# Patient Record
Sex: Female | Born: 1985 | Race: Asian | Hispanic: No | Marital: Single | Smoking: Never smoker
Health system: Southern US, Community
[De-identification: ages and names within clinical notes are randomized; demographics above are authoritative.]

## PROBLEM LIST (undated history)

## (undated) ENCOUNTER — Inpatient Hospital Stay (HOSPITAL_COMMUNITY): Payer: No Typology Code available for payment source

## (undated) DIAGNOSIS — L0291 Cutaneous abscess, unspecified: Secondary | ICD-10-CM

## (undated) DIAGNOSIS — N739 Female pelvic inflammatory disease, unspecified: Secondary | ICD-10-CM

## (undated) DIAGNOSIS — J45909 Unspecified asthma, uncomplicated: Secondary | ICD-10-CM

## (undated) DIAGNOSIS — B977 Papillomavirus as the cause of diseases classified elsewhere: Secondary | ICD-10-CM

## (undated) HISTORY — PX: CHOLECYSTECTOMY: SHX55

---
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
## 2008-03-16 ENCOUNTER — Emergency Department (HOSPITAL_COMMUNITY): Admission: EM | Admit: 2008-03-16 | Discharge: 2008-03-16 | Payer: Self-pay | Admitting: *Deleted

## 2008-11-10 ENCOUNTER — Inpatient Hospital Stay (HOSPITAL_COMMUNITY): Admission: EM | Admit: 2008-11-10 | Discharge: 2008-11-12 | Payer: Self-pay | Admitting: Emergency Medicine

## 2010-07-02 ENCOUNTER — Emergency Department (HOSPITAL_COMMUNITY)
Admission: EM | Admit: 2010-07-02 | Discharge: 2010-07-02 | Payer: Self-pay | Source: Home / Self Care | Admitting: Emergency Medicine

## 2010-09-18 LAB — RAPID STREP SCREEN (MED CTR MEBANE ONLY): Streptococcus, Group A Screen (Direct): NEGATIVE

## 2010-10-17 LAB — DIFFERENTIAL
Basophils Absolute: 0 10*3/uL (ref 0.0–0.1)
Basophils Absolute: 0 10*3/uL (ref 0.0–0.1)
Basophils Absolute: 0.1 10*3/uL (ref 0.0–0.1)
Basophils Relative: 0 % (ref 0–1)
Basophils Relative: 0 % (ref 0–1)
Eosinophils Absolute: 0 10*3/uL (ref 0.0–0.7)
Eosinophils Relative: 0 % (ref 0–5)
Eosinophils Relative: 1 % (ref 0–5)
Lymphocytes Relative: 12 % (ref 12–46)
Lymphocytes Relative: 26 % (ref 12–46)
Lymphs Abs: 2.1 10*3/uL (ref 0.7–4.0)
Monocytes Absolute: 0.1 10*3/uL (ref 0.1–1.0)
Monocytes Absolute: 0.7 10*3/uL (ref 0.1–1.0)
Monocytes Relative: 1 % — ABNORMAL LOW (ref 3–12)
Neutro Abs: 18.1 10*3/uL — ABNORMAL HIGH (ref 1.7–7.7)
Neutro Abs: 7.2 10*3/uL (ref 1.7–7.7)
Neutrophils Relative %: 82 % — ABNORMAL HIGH (ref 43–77)

## 2010-10-17 LAB — ANAEROBIC CULTURE

## 2010-10-17 LAB — CBC
HCT: 39.9 % (ref 36.0–46.0)
HCT: 41.7 % (ref 36.0–46.0)
Hemoglobin: 11.2 g/dL — ABNORMAL LOW (ref 12.0–15.0)
Hemoglobin: 13.6 g/dL (ref 12.0–15.0)
MCHC: 34.1 g/dL (ref 30.0–36.0)
MCHC: 34.3 g/dL (ref 30.0–36.0)
MCV: 87.9 fL (ref 78.0–100.0)
Platelets: 246 10*3/uL (ref 150–400)
Platelets: 292 10*3/uL (ref 150–400)
RBC: 4.54 MIL/uL (ref 3.87–5.11)
RDW: 11.9 % (ref 11.5–15.5)
RDW: 12.1 % (ref 11.5–15.5)
RDW: 12.2 % (ref 11.5–15.5)

## 2010-10-17 LAB — URINALYSIS, ROUTINE W REFLEX MICROSCOPIC
Glucose, UA: NEGATIVE mg/dL
Ketones, ur: NEGATIVE mg/dL
Nitrite: NEGATIVE
Protein, ur: NEGATIVE mg/dL

## 2010-10-17 LAB — COMPREHENSIVE METABOLIC PANEL
ALT: 21 U/L (ref 0–35)
Albumin: 3.7 g/dL (ref 3.5–5.2)
BUN: 8 mg/dL (ref 6–23)
GFR calc Af Amer: >60 mL/min (ref >60)
GFR calc non Af Amer: >60 mL/min (ref >60)
Potassium: 3.3 mEq/L — ABNORMAL LOW (ref 3.5–5.1)
Sodium: 139 mEq/L (ref 135–145)
Total Bilirubin: 1.4 mg/dL — ABNORMAL HIGH (ref 0.3–1.2)
Total Protein: 8 g/dL (ref 6.0–8.3)

## 2010-10-17 LAB — CULTURE, ROUTINE-ABSCESS

## 2010-10-17 LAB — URINE MICROSCOPIC-ADD ON

## 2010-10-28 ENCOUNTER — Emergency Department (HOSPITAL_COMMUNITY)
Admission: EM | Admit: 2010-10-28 | Discharge: 2010-10-29 | Disposition: A | Discharge status: Home / Self Care | Payer: No Typology Code available for payment source | Attending: Emergency Medicine | Admitting: Emergency Medicine

## 2010-10-28 ENCOUNTER — Emergency Department (HOSPITAL_COMMUNITY): Payer: No Typology Code available for payment source

## 2010-10-28 DIAGNOSIS — R51 Headache: Secondary | ICD-10-CM | POA: Insufficient documentation

## 2010-10-28 DIAGNOSIS — J45909 Unspecified asthma, uncomplicated: Secondary | ICD-10-CM | POA: Insufficient documentation

## 2010-10-28 DIAGNOSIS — IMO0002 Reserved for concepts with insufficient information to code with codable children: Secondary | ICD-10-CM | POA: Insufficient documentation

## 2010-10-28 DIAGNOSIS — R071 Chest pain on breathing: Secondary | ICD-10-CM | POA: Insufficient documentation

## 2010-10-28 DIAGNOSIS — M542 Cervicalgia: Secondary | ICD-10-CM | POA: Insufficient documentation

## 2010-10-29 LAB — POCT PREGNANCY, URINE: Preg Test, Ur: NEGATIVE

## 2010-11-21 NOTE — H&P (Signed)
NAME:  Danielle Spence, Danielle Spence                    ACCOUNT NO.:  [id number]   MEDICAL RECORD NO.:  [id number]          PATIENT TYPE:  INP   LOCATION:  5151                         FACILITY:  [facility]   PHYSICIAN:  Thomas A. Cornett, M.D.DATE OF BIRTH:  01-28-1986   DATE OF ADMISSION:  11/10/2008  DATE OF DISCHARGE:                              HISTORY & PHYSICAL   CHIEF COMPLAINT:  Pain in the buttock.   HISTORY OF PRESENT ILLNESS:  This patient is a 25 year old female with a  1-week history of pain in her right buttock.  It has been present for a  week.  It has gotten worse over the last week with increased redness,  swelling, without drainage.  She has also had a low-grade fever with  temperature of 101.3.  She was evaluated by the emergency room and felt  to have a large perirectal abscess and was asked to see per request, the  emergency room physician.   The pain is 10/10.  This is made worse with sitting.   PAST MEDICAL HISTORY:  Asthma.   PAST SURGICAL HISTORY:  None.  She does have an IUD in place.   FAMILY HISTORY:  Noncontributory.   SOCIAL HISTORY:  She denies tobacco or alcohol use.   ALLERGIES:  No known drug allergies.   MEDICATIONS:  Currently, none.   REVIEW OF SYSTEMS:  Positive for fevers, chills, and right buttock pain,  otherwise negative x15.   PHYSICAL EXAMINATION:  VITAL SIGNS:  Temperature 101.3, pulse 131, blood  pressure 133/84.  HEENT:  No evidence of scleral icterus.  Oropharynx clear.  NECK:  Supple and nontender.  Trachea midline.  PULMONARY:  Clear to auscultation.  Chest wall motion is normal  bilaterally.  CARDIOVASCULAR:  Tachycardia without rub, murmur, or gallop.  She is  warm and well perfused in her extremities.  ABDOMEN:  Soft, nontender, nondistended.  GU:  She has a large right-sided perirectal abscess which is red,  fluctuant, about the size of a baseball.  EXTREMITIES:  No clubbing, cyanosis, or edema.  NEUROLOGIC:  Grossly normal.   DIAGNOSTIC STUDIES:  Her white count is 17,900 with a left shift,  hemoglobin 14.  Sodium 139, potassium 3.3, chloride 107, CO2 of 26, BUN  8, creatinine 0.89, glucose 108.   IMPRESSION:  Large perirectal abscess.   PLAN:  I recommended drainage in the operating room under general  anesthesia.  Risk of bleeding, infection, fistula.  She understands the  above and agrees to proceed.      Thomas A. Cornett, M.D.  Electronically Signed     TAC/MEDQ  D:  11/10/2008  T:  11/11/2008  Job:  [id number]

## 2010-11-21 NOTE — Op Note (Signed)
NAME:  Danielle Spence, Danielle Spence                    ACCOUNT NO.:  [id number]   MEDICAL RECORD NO.:  [id number]          PATIENT TYPE:  INP   LOCATION:  2550                         FACILITY:  [facility]   PHYSICIAN:  Maisie Fus A. Cornett, M.D.DATE OF BIRTH:  1985/10/28   DATE OF PROCEDURE:  DATE OF DISCHARGE:                               OPERATIVE REPORT   PREOPERATIVE DIAGNOSIS:  Right buttock abscess.   POSTOPERATIVE DIAGNOSIS:  Right buttock abscess.   PROCEDURE:  Incision and drainage of right buttock abscess.   SURGEON:  Maisie Fus A. Cornett, MD   ANESTHESIA:  General endotracheal anesthesia 0.25% Sensorcaine local.   ESTIMATED BLOOD LOSS:  20 mL.   SPECIMEN:  Cultures taken.   DRAIN:  Iodoform packing placed.   INDICATIONS FOR PROCEDURE:  The patient is a 25 year old female with a 1-  week history of right buttock and perianal pain.  She had what appeared  to be a large right-sided perirectal abscess and was taken to the  operating room for drainage.   DESCRIPTION OF PROCEDURE:  The patient brought to the operating room  after getting informed consent.  After general anesthesia, she was  placed in stirrups and her perineum was prepped and draped in sterile  fashion.  On the right buttock approximately 4 cm from the anal verge  was a large fluctuant 4-5 cm area of redness and fluctuance.  A scalpel  was used and this was entered.  Copious amounts of pus came out.  This  was cultured.  I placed my finger and this actually tracked deep down  toward her ischium on the right.  Rectal examination was done, which  showed no communication with this and this appeared to be just a right  buttock abscess.  It was little difficult to tell, but I did not see any  relationship to the rectum.  This was irrigated out and packed with 1-  inch Iodoform packing.  The patient was taken out of stirrups,  extubated, and taken to recovery in satisfactory condition.      Thomas A. Cornett, M.D.  Electronically  Signed     TAC/MEDQ  D:  11/10/2008  T:  11/11/2008  Job:  [id number]

## 2011-10-06 ENCOUNTER — Encounter (HOSPITAL_COMMUNITY): Payer: Self-pay

## 2011-10-06 ENCOUNTER — Inpatient Hospital Stay (HOSPITAL_COMMUNITY)
Admission: AD | Admit: 2011-10-06 | Discharge: 2011-10-06 | Disposition: A | Discharge status: Home / Self Care | Payer: Self-pay | Source: Ambulatory Visit | Attending: Obstetrics & Gynecology | Admitting: Obstetrics & Gynecology

## 2011-10-06 DIAGNOSIS — K469 Unspecified abdominal hernia without obstruction or gangrene: Secondary | ICD-10-CM

## 2011-10-06 DIAGNOSIS — R109 Unspecified abdominal pain: Secondary | ICD-10-CM | POA: Insufficient documentation

## 2011-10-06 LAB — URINALYSIS, ROUTINE W REFLEX MICROSCOPIC
Bilirubin Urine: NEGATIVE
Nitrite: NEGATIVE
Protein, ur: NEGATIVE mg/dL
Urobilinogen, UA: 0.2 mg/dL (ref 0.0–1.0)

## 2011-10-06 MED ORDER — ZOLPIDEM TARTRATE 10 MG PO TABS: 10.0000 mg | ORAL_TABLET | Freq: Every evening | ORAL | Status: DC | PRN

## 2011-10-06 NOTE — Discharge Instructions (Signed)
Hernia  A hernia occurs when an internal organ pushes out through a weak spot in the abdominal wall. Hernias most commonly occur in the groin and around the navel. Hernias often can be pushed back into place (reduced). Most hernias tend to get worse over time. Some abdominal hernias can get stuck in the opening (irreducible or incarcerated hernia) and cannot be reduced. An irreducible abdominal hernia which is tightly squeezed into the opening is at risk for impaired blood supply (strangulated hernia). A strangulated hernia is a medical emergency. Because of the risk for an irreducible or strangulated hernia, surgery may be recommended to repair a hernia.  CAUSES    Heavy lifting.   Prolonged coughing.   Straining to have a bowel movement.   A cut (incision) made during an abdominal surgery.  HOME CARE INSTRUCTIONS    Bed rest is not required. You may continue your normal activities.   Avoid lifting more than 10 pounds (4.5 kg) or straining.   Cough gently. If you are a smoker it is best to stop. Even the best hernia repair can break down with the continual strain of coughing. Even if you do not have your hernia repaired, a cough will continue to aggravate the problem.   Do not wear anything tight over your hernia. Do not try to keep it in with an outside bandage or truss. These can damage abdominal contents if they are trapped within the hernia sac.   Eat a normal diet.   Avoid constipation. Straining over long periods of time will increase hernia size and encourage breakdown of repairs. If you cannot do this with diet alone, stool softeners may be used.  SEEK IMMEDIATE MEDICAL CARE IF:    You have a fever.   You develop increasing abdominal pain.   You feel nauseous or vomit.   Your hernia is stuck outside the abdomen, looks discolored, feels hard, or is tender.   You have any changes in your bowel habits or in the hernia that are unusual for you.   You have increased pain or swelling around the  hernia.   You cannot push the hernia back in place by applying gentle pressure while lying down.  MAKE SURE YOU:    Understand these instructions.   Will watch your condition.   Will get help right away if you are not doing well or get worse.  Document Released: 06/25/2005 Document Revised: 06/14/2011 Document Reviewed: 02/12/2008  [employer] Patient Information 2012 [employer].

## 2011-10-06 NOTE — MAU Note (Signed)
Onset of abdominal pain in middle of stomach painful, had IUD removed by [facility]. Last week has been bleeding ever since, on BCP now.

## 2011-10-06 NOTE — ED Provider Notes (Signed)
First Provider Initiated Contact with Patient 10/06/11 1628      Chief Complaint:  Abdominal Pain SHe has had intermittent pain above her umbilicus for several months, a little worse today. 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  Pain is exacerbated by lifting and cerrtain movements  Danielle Spence is  26 y.o. No obstetric history on file..  Patient's last menstrual period was 10/02/2011.Marland Kitchen  Her pregnancy status is negative.     No past medical history on file.  No past surgical history on file.  No family history on file.  History  Substance Use Topics  . Smoking status: Not on file  . Smokeless tobacco: Not on file  . Alcohol Use: Not on file    Allergies: No Known Allergies  No prescriptions prior to admission    Review of Systems - Negative except above   Physical Exam   Blood pressure 123/72, pulse 95, temperature 98.9 F (37.2 C), temperature source Oral, resp. rate 16, height 5\' 2"  (1.575 m), weight 77.111 kg (170 lb), last menstrual period 10/02/2011.  General: General appearance - alert, well appearing, and in no distress and oriented to person, place, and time Chest - clear to auscultation, no wheezes, rales or rhonchi, symmetric air entry Heart - normal rate and regular rhythm Abdomen - soft nontender except area right above umbilicus.  Worse with crunch, ? hernia Pelvic - examination not indicated Extremities - peripheral pulses normal, no pedal edema, no clubbing or cyanosis   Labs: Recent Results (from the past 24 hour(s))  URINALYSIS, ROUTINE W REFLEX MICROSCOPIC   Collection Time   10/06/11  4:20 PM      Component Value Range   Color, Urine YELLOW  YELLOW    APPearance CLEAR  CLEAR    Specific Gravity, Urine 1.025  1.005 - 1.030    pH 6.0  5.0 - 8.0    Glucose, UA NEGATIVE  NEGATIVE (mg/dL)   Hgb urine dipstick TRACE (*) NEGATIVE    Bilirubin Urine NEGATIVE  NEGATIVE    Ketones, ur NEGATIVE  NEGATIVE (mg/dL)   Protein, ur NEGATIVE  NEGATIVE  (mg/dL)   Urobilinogen, UA 0.2  0.0 - 1.0 (mg/dL)   Nitrite NEGATIVE  NEGATIVE    Leukocytes, UA NEGATIVE  NEGATIVE   URINE MICROSCOPIC-ADD ON   Collection Time   10/06/11  4:20 PM      Component Value Range   Squamous Epithelial / LPF RARE  RARE    RBC / HPF 0-2  <3 (RBC/hpf)   Bacteria, UA RARE  RARE   POCT PREGNANCY, URINE   Collection Time   10/06/11  4:30 PM      Component Value Range   Preg Test, Ur NEGATIVE  NEGATIVE    Imaging Studies:  No results found.   Assessment: There is no problem list on file for this patient.   Plan: Dr, Debroah Loop notified.  Pt does not have a PCP, [place] number to Physician Finder Assist given, to contact general surgeon for evaluation.  Abdominal binder given to pt  CRESENZO-DISHMAN,[name]

## 2011-12-27 ENCOUNTER — Encounter (HOSPITAL_COMMUNITY): Payer: Self-pay | Admitting: *Deleted

## 2011-12-27 ENCOUNTER — Emergency Department (HOSPITAL_COMMUNITY)
Admission: EM | Admit: 2011-12-27 | Discharge: 2011-12-27 | Disposition: A | Discharge status: Home / Self Care | Payer: Self-pay | Attending: Emergency Medicine | Admitting: Emergency Medicine

## 2011-12-27 DIAGNOSIS — B9689 Other specified bacterial agents as the cause of diseases classified elsewhere: Secondary | ICD-10-CM | POA: Insufficient documentation

## 2011-12-27 DIAGNOSIS — A499 Bacterial infection, unspecified: Secondary | ICD-10-CM | POA: Insufficient documentation

## 2011-12-27 DIAGNOSIS — N760 Acute vaginitis: Secondary | ICD-10-CM | POA: Insufficient documentation

## 2011-12-27 HISTORY — DX: Female pelvic inflammatory disease, unspecified: N73.9

## 2011-12-27 HISTORY — DX: Papillomavirus as the cause of diseases classified elsewhere: B97.7

## 2011-12-27 HISTORY — DX: Unspecified asthma, uncomplicated: J45.909

## 2011-12-27 LAB — URINE MICROSCOPIC-ADD ON

## 2011-12-27 LAB — URINALYSIS, ROUTINE W REFLEX MICROSCOPIC
Glucose, UA: NEGATIVE mg/dL
Hgb urine dipstick: NEGATIVE
pH: 7.5 (ref 5.0–8.0)

## 2011-12-27 LAB — WET PREP, GENITAL
Trich, Wet Prep: NONE SEEN
Yeast Wet Prep HPF POC: NONE SEEN

## 2011-12-27 MED ORDER — IBUPROFEN 800 MG PO TABS: 800.0000 mg | ORAL_TABLET | Freq: Three times a day (TID) | ORAL | Status: AC

## 2011-12-27 MED ORDER — OXYCODONE-ACETAMINOPHEN 5-325 MG PO TABS: 2.0000 | ORAL_TABLET | ORAL | Status: AC | PRN

## 2011-12-27 MED ORDER — ONDANSETRON 4 MG PO TBDP
8.0000 mg | ORAL_TABLET | Freq: Once | ORAL | Status: AC
  Administered 2011-12-27: 8 mg via ORAL
  Filled 2011-12-27: qty 2

## 2011-12-27 MED ORDER — METRONIDAZOLE 500 MG PO TABS: 500.0000 mg | ORAL_TABLET | Freq: Two times a day (BID) | ORAL | Status: AC

## 2011-12-27 MED ORDER — IBUPROFEN 800 MG PO TABS
800.0000 mg | ORAL_TABLET | Freq: Once | ORAL | Status: AC
  Administered 2011-12-27: 800 mg via ORAL
  Filled 2011-12-27: qty 1

## 2011-12-27 NOTE — ED Notes (Signed)
Pt states that she has chronic pain in her back that yesterday morning she started having another flare up of back pain. Pt states that she has been having general body aches as well. Pt states vaginal discharge and odor too.

## 2011-12-27 NOTE — ED Notes (Signed)
Pt alert and oriented, with steady gait at time of discharge. Pt given discharge papers and papers explained. All questions answered and pt walked to discharge.  

## 2011-12-27 NOTE — ED Provider Notes (Signed)
History     CSN: [id number]  Arrival date & time 12/27/11  1701   First MD Initiated Contact with Patient 12/27/11 1835      Chief Complaint  Patient presents with  . Back Pain    (Consider location/radiation/quality/duration/timing/severity/associated sxs/prior treatment) Patient is a 26 y.o. female presenting with back pain and vaginal discharge. The history is provided by the patient. No language interpreter was used.  Back Pain  This is a new problem. The current episode started 12 to 24 hours ago. The problem occurs daily. The pain is associated with no known injury. The pain is at a severity of 5/10. The pain is moderate. The symptoms are aggravated by bending. Associated symptoms include abdominal pain. Pertinent negatives include no fever, no numbness, no bowel incontinence, no perianal numbness, no bladder incontinence, no pelvic pain, no paresthesias, no paresis and no weakness. She has tried analgesics for the symptoms.  Vaginal Discharge Associated symptoms include abdominal pain and nausea. Pertinent negatives include no fever, numbness, urinary symptoms, vomiting or weakness. The symptoms are aggravated by bending.   26 year old female complaining of back pain and vaginal discharge today. States that the discharges been on and off over a long period of time but has been on for the last 6 days. Nausea but no vomiting. Had her mirena removed in April and she has been on birth control pills since. Also having back pain over the last several days. Took one Tylenol with no results. Back pain is bilateral lower back pain with no bowel or bladder incontinence. No paresthesia.  Past Medical History  Diagnosis Date  . PID (pelvic inflammatory disease)   . HPV (human papilloma virus) infection   . Asthma     History reviewed. No pertinent past surgical history.  History reviewed. No pertinent family history.  History  Substance Use Topics  . Smoking status: Never Smoker   .  Smokeless tobacco: Not on file  . Alcohol Use: No    OB History    Grav Para Term Preterm Abortions TAB SAB Ect Mult Living                  Review of Systems  Constitutional: Negative.  Negative for fever.  HENT: Negative.   Eyes: Negative.   Respiratory: Negative.   Cardiovascular: Negative.   Gastrointestinal: Positive for nausea and abdominal pain. Negative for vomiting and bowel incontinence.  Genitourinary: Positive for vaginal discharge. Negative for bladder incontinence and pelvic pain.  Musculoskeletal: Positive for back pain.  Neurological: Negative.  Negative for weakness, numbness and paresthesias.  Psychiatric/Behavioral: Negative.   All other systems reviewed and are negative.    Allergies  Review of patient's allergies indicates no known allergies.  Home Medications   Current Outpatient Rx  Name Route Sig Dispense Refill  . ACETAMINOPHEN 500 MG PO TABS Oral Take 500 mg by mouth every 6 (six) hours as needed. For pain    . ADULT MULTIVITAMIN W/MINERALS CH Oral Take 1 tablet by mouth daily.    Marland Kitchen PRESCRIPTION MEDICATION Oral Take 1 tablet by mouth daily. Birth control      BP 124/76  Pulse 102  Temp 98.4 F (36.9 C) (Oral)  Resp 18  SpO2 100%  Physical Exam  Nursing note and vitals reviewed. Constitutional: She is oriented to person, place, and time. She appears well-developed and well-nourished.  HENT:  Head: Normocephalic and atraumatic.  Eyes: Conjunctivae and EOM are normal. Pupils are equal, round, and reactive to light.  Neck: Normal range of motion. Neck supple.  Cardiovascular: Normal rate, regular rhythm, normal heart sounds and intact distal pulses.   Pulmonary/Chest: Effort normal and breath sounds normal.  Abdominal: Soft. Bowel sounds are normal.       Periumbilical pain  Genitourinary: Vaginal discharge found.  Musculoskeletal: Normal range of motion. She exhibits no edema and no tenderness.       Bilateral lower back pain.    Neurological: She is alert and oriented to person, place, and time. She has normal reflexes.  Skin: Skin is warm and dry.  Psychiatric: She has a normal mood and affect.    ED Course  Pelvic exam Date/Time: 12/27/2011 10:06 PM Performed by: Remi Haggard Authorized by: Remi Haggard Consent: Verbal consent obtained. Written consent not obtained. Risks and benefits: risks, benefits and alternatives were discussed Consent given by: patient Patient understanding: patient states understanding of the procedure being performed Patient identity confirmed: verbally with patient, arm band, provided demographic data and hospital-assigned identification number Time out: Immediately prior to procedure a "time out" was called to verify the correct patient, procedure, equipment, support staff and site/side marked as required. Preparation: Patient was prepped and draped in the usual sterile fashion. Local anesthesia used: no Patient sedated: no Patient tolerance: Patient tolerated the procedure well with no immediate complications.   (including critical care time)  Labs Reviewed  URINALYSIS, ROUTINE W REFLEX MICROSCOPIC - Abnormal; Notable for the following:    Bilirubin Urine LARGE (*)     Leukocytes, UA SMALL (*)     All other components within normal limits  URINE MICROSCOPIC-ADD ON - Abnormal; Notable for the following:    Squamous Epithelial / LPF FEW (*)     All other components within normal limits  POCT PREGNANCY, URINE  WET PREP, GENITAL  GC/CHLAMYDIA PROBE AMP, GENITAL   No results found.   No diagnosis found.    MDM  Patient has bacterial vaginosis. She'll be treated with Flagyl for 7 days. [facility]. No intercourse until followup and have her partner checked as well. Percocet for extreme pain.        Remi Haggard, NP 12/27/11 2230

## 2011-12-27 NOTE — Discharge Instructions (Signed)
Ms Furtick you have bacterial vaginosis.  Take the antibiotics until they are gone.  Follow up [facility]  Next week to make sure the infection is gone.  No intercourse until then.  Have your partner checked there as well.  Do not drink alcohol with this medication.    Bacterial Vaginosis Bacterial vaginosis is an infection of the vagina. A healthy vagina has many kinds of good germs (bacteria). Sometimes the number of good germs can change. This allows bad germs to move in and cause an infection. You may be given medicine (antibiotics) to treat the infection. Or, you may not need treatment at all. HOME CARE  Take your medicine as told. Finish them even if you start to feel better.   Do not have sex until you finish your medicine.   Do not douche.   Practice safe sex.   Tell your sex partner that you have an infection. They should see their doctor for treatment if they have problems.  GET HELP RIGHT AWAY IF:  You do not get better after 3 days of treatment.   You have grey fluid (discharge) coming from your vagina.   You have pain.   You have a temperature of 102 F (38.9 C) or higher.  MAKE SURE YOU:   Understand these instructions.   Will watch your condition.   Will get help right away if you are not doing well or get worse.  Document Released: 04/03/2008 Document Revised: 06/14/2011 Document Reviewed: 04/03/2008 [facility] Patient Information 2012 [place], [place].

## 2011-12-27 NOTE — ED Notes (Signed)
Pelvic cart at bedside. 

## 2011-12-28 NOTE — ED Provider Notes (Signed)
Medical screening examination/treatment/procedure(s) were performed by non-physician practitioner and as supervising physician I was immediately available for consultation/collaboration.   Carleene Cooper III, MD 12/28/11 1315

## 2012-02-04 ENCOUNTER — Inpatient Hospital Stay (HOSPITAL_COMMUNITY)
Admission: EM | Admit: 2012-02-04 | Discharge: 2012-02-09 | DRG: 603 | Disposition: A | Discharge status: Home / Self Care | Payer: Self-pay | Attending: Internal Medicine | Admitting: Internal Medicine

## 2012-02-04 ENCOUNTER — Encounter (HOSPITAL_COMMUNITY): Payer: Self-pay | Admitting: *Deleted

## 2012-02-04 DIAGNOSIS — L0390 Cellulitis, unspecified: Secondary | ICD-10-CM

## 2012-02-04 DIAGNOSIS — L02219 Cutaneous abscess of trunk, unspecified: Principal | ICD-10-CM | POA: Diagnosis present

## 2012-02-04 DIAGNOSIS — J45909 Unspecified asthma, uncomplicated: Secondary | ICD-10-CM | POA: Diagnosis present

## 2012-02-04 DIAGNOSIS — E876 Hypokalemia: Secondary | ICD-10-CM | POA: Diagnosis present

## 2012-02-04 DIAGNOSIS — Z6831 Body mass index (BMI) 31.0-31.9, adult: Secondary | ICD-10-CM

## 2012-02-04 DIAGNOSIS — L03312 Cellulitis of back [any part except buttock]: Secondary | ICD-10-CM

## 2012-02-04 DIAGNOSIS — I498 Other specified cardiac arrhythmias: Secondary | ICD-10-CM | POA: Diagnosis present

## 2012-02-04 LAB — CBC WITH DIFFERENTIAL/PLATELET
Basophils Relative: 0 % (ref 0–1)
Eosinophils Absolute: 0 10*3/uL (ref 0.0–0.7)
Eosinophils Relative: 0 % (ref 0–5)
Hemoglobin: 13.9 g/dL (ref 12.0–15.0)
Lymphs Abs: 2.1 10*3/uL (ref 0.7–4.0)
MCH: 29.1 pg (ref 26.0–34.0)
MCHC: 34.2 g/dL (ref 30.0–36.0)
MCV: 85.1 fL (ref 78.0–100.0)
Monocytes Absolute: 1.6 10*3/uL — ABNORMAL HIGH (ref 0.1–1.0)
Neutro Abs: 22.8 10*3/uL — ABNORMAL HIGH (ref 1.7–7.7)
RBC: 4.77 MIL/uL (ref 3.87–5.11)

## 2012-02-04 LAB — COMPREHENSIVE METABOLIC PANEL
ALT: 17 U/L (ref 0–35)
BUN: 5 mg/dL — ABNORMAL LOW (ref 6–23)
CO2: 21 mEq/L (ref 19–32)
Calcium: 9.1 mg/dL (ref 8.4–10.5)
Creatinine, Ser: 0.75 mg/dL (ref 0.50–1.10)
GFR calc Af Amer: >90 mL/min (ref >90)
GFR calc non Af Amer: >90 mL/min (ref >90)
Glucose, Bld: 178 mg/dL — ABNORMAL HIGH (ref 70–99)

## 2012-02-04 MED ORDER — ACETAMINOPHEN 325 MG PO TABS
650.0000 mg | ORAL_TABLET | Freq: Once | ORAL | Status: AC
  Administered 2012-02-04: 650 mg via ORAL
  Filled 2012-02-04 (×2): qty 2

## 2012-02-04 MED ORDER — SODIUM CHLORIDE 0.9 % IV BOLUS (SEPSIS)
1000.0000 mL | Freq: Once | INTRAVENOUS | Status: AC
  Administered 2012-02-05: 1000 mL via INTRAVENOUS

## 2012-02-04 MED ORDER — SODIUM CHLORIDE 0.9 % IV SOLN: Freq: Once | INTRAVENOUS | Status: AC

## 2012-02-04 MED ORDER — MORPHINE SULFATE 4 MG/ML IJ SOLN
4.0000 mg | Freq: Once | INTRAMUSCULAR | Status: AC
  Administered 2012-02-04: 4 mg via INTRAVENOUS
  Filled 2012-02-04: qty 1

## 2012-02-04 MED ORDER — SODIUM CHLORIDE 0.9 % IV BOLUS (SEPSIS)
1000.0000 mL | Freq: Once | INTRAVENOUS | Status: AC
  Administered 2012-02-04: 1000 mL via INTRAVENOUS

## 2012-02-04 MED ORDER — HYDROCODONE-ACETAMINOPHEN 5-325 MG PO TABS
1.0000 | ORAL_TABLET | Freq: Once | ORAL | Status: AC
  Administered 2012-02-05: 1 via ORAL
  Filled 2012-02-04: qty 1

## 2012-02-04 MED ORDER — POTASSIUM CHLORIDE CRYS ER 20 MEQ PO TBCR
40.0000 meq | EXTENDED_RELEASE_TABLET | Freq: Once | ORAL | Status: AC
  Administered 2012-02-05: 40 meq via ORAL
  Filled 2012-02-04: qty 2

## 2012-02-04 MED ORDER — VANCOMYCIN HCL 1000 MG IV SOLR
1250.0000 mg | INTRAVENOUS | Status: AC
  Administered 2012-02-05: 1250 mg via INTRAVENOUS
  Filled 2012-02-04: qty 1250

## 2012-02-04 MED ORDER — ONDANSETRON HCL 4 MG/2ML IJ SOLN
4.0000 mg | Freq: Once | INTRAMUSCULAR | Status: AC
Start: 2012-02-04 — End: 2012-02-04
  Administered 2012-02-04: 4 mg via INTRAVENOUS
  Filled 2012-02-04: qty 2

## 2012-02-04 NOTE — ED Notes (Signed)
Patient with abcess to her back since yesterday.  Sibling popped the abcess and it is now draining.  Patient also c/o fever at home

## 2012-02-04 NOTE — ED Provider Notes (Addendum)
History     CSN: [id number]  Arrival date & time 02/04/12  2000   First MD Initiated Contact with Patient 02/04/12 2138      Chief Complaint  Patient presents with  . Fever    (Consider location/radiation/quality/duration/timing/severity/associated sxs/prior treatment) HPI Comments: Patient presents with 2-3 days of a boil on her back that has been draining a small amount of pus.  She notes increasing redness and pain and swelling around the area.  She has a fever today and does not feel well.  She does not have nausea or vomiting.  This is her only lesion.  Patient has not had a history of boils or abscesses in the past.  No radiation of her pain  The history is provided by the patient. No language interpreter was used.    Past Medical History  Diagnosis Date  . PID (pelvic inflammatory disease)   . HPV (human papilloma virus) infection   . Asthma     History reviewed. No pertinent past surgical history.  History reviewed. No pertinent family history.  History  Substance Use Topics  . Smoking status: Never Smoker   . Smokeless tobacco: Not on file  . Alcohol Use: No    OB History    Grav Para Term Preterm Abortions TAB SAB Ect Mult Living                  Review of Systems  Constitutional: Negative.  Negative for fever and chills.  HENT: Negative.   Eyes: Negative.   Respiratory: Negative.  Negative for cough and shortness of breath.   Cardiovascular: Negative.  Negative for chest pain.  Gastrointestinal: Negative.  Negative for nausea, vomiting, abdominal pain and diarrhea.  Genitourinary: Negative.   Musculoskeletal: Positive for back pain.  Skin: Positive for color change and wound. Negative for rash.  Neurological: Negative.  Negative for syncope and headaches.  Hematological: Negative.  Negative for adenopathy.  Psychiatric/Behavioral: Negative.  Negative for confusion.  All other systems reviewed and are negative.    Allergies  Review of patient's  allergies indicates no known allergies.  Home Medications   Current Outpatient Rx  Name Route Sig Dispense Refill  . ACETAMINOPHEN 500 MG PO TABS Oral Take 500 mg by mouth every 6 (six) hours as needed. For pain    . ADULT MULTIVITAMIN W/MINERALS CH Oral Take 1 tablet by mouth daily.      BP 102/49  Pulse 140  Temp 98.5 F (36.9 C) (Oral)  Resp 18  SpO2 100%  Physical Exam  Nursing note and vitals reviewed. Constitutional: She is oriented to person, place, and time. She appears well-developed and well-nourished.  Non-toxic appearance. She does not have a sickly appearance.  HENT:  Head: Normocephalic and atraumatic.  Eyes: Conjunctivae, EOM and lids are normal. Pupils are equal, round, and reactive to light. No scleral icterus.  Neck: Trachea normal and normal range of motion. Neck supple.  Cardiovascular: Regular rhythm and normal heart sounds.  Tachycardia present.   Pulmonary/Chest: Effort normal and breath sounds normal. No respiratory distress. She has no wheezes. She has no rales.  Abdominal: Soft. Normal appearance. There is no tenderness. There is no CVA tenderness.  Musculoskeletal: Normal range of motion.  Neurological: She is alert and oriented to person, place, and time. She has normal strength.  Skin: Skin is warm, dry and intact. No rash noted.     Psychiatric: She has a normal mood and affect. Her behavior is normal. Judgment and  thought content normal.    ED Course  Procedures (including critical care time)  Results for orders placed during the hospital encounter of 02/04/12  CBC WITH DIFFERENTIAL      Component Value Range   WBC 26.5 (*) 4.0 - 10.5 K/uL   RBC 4.77  3.87 - 5.11 MIL/uL   Hemoglobin 13.9  12.0 - 15.0 g/dL   HCT 40.9  81.1 - 91.4 %   MCV 85.1  78.0 - 100.0 fL   MCH 29.1  26.0 - 34.0 pg   MCHC 34.2  30.0 - 36.0 g/dL   RDW 78.2  95.6 - 21.3 %   Platelets 309  150 - 400 K/uL   Neutrophils Relative 86 (*) 43 - 77 %   Lymphocytes Relative 8  (*) 12 - 46 %   Monocytes Relative 6  3 - 12 %   Eosinophils Relative 0  0 - 5 %   Basophils Relative 0  0 - 1 %   Neutro Abs 22.8 (*) 1.7 - 7.7 K/uL   Lymphs Abs 2.1  0.7 - 4.0 K/uL   Monocytes Absolute 1.6 (*) 0.1 - 1.0 K/uL   Eosinophils Absolute 0.0  0.0 - 0.7 K/uL   Basophils Absolute 0.0  0.0 - 0.1 K/uL   Smear Review PLATELETS APPEAR ADEQUATE    COMPREHENSIVE METABOLIC PANEL      Component Value Range   Sodium 136  135 - 145 mEq/L   Potassium 2.8 (*) 3.5 - 5.1 mEq/L   Chloride 99  96 - 112 mEq/L   CO2 21  19 - 32 mEq/L   Glucose, Bld 178 (*) 70 - 99 mg/dL   BUN 5 (*) 6 - 23 mg/dL   Creatinine, Ser 0.86  0.50 - 1.10 mg/dL   Calcium 9.1  8.4 - 57.8 mg/dL   Total Protein 7.9  6.0 - 8.3 g/dL   Albumin 3.4 (*) 3.5 - 5.2 g/dL   AST 16  0 - 37 U/L   ALT 17  0 - 35 U/L   Alkaline Phosphatase 80  39 - 117 U/L   Total Bilirubin 1.1  0.3 - 1.2 mg/dL   GFR calc non Af Amer >90  >90 mL/min   GFR calc Af Amer >90  >90 mL/min       MDM  Patient with area of small boil but no significant drainage on incision and drainage performed by myself.  Patient does have significant surrounding cellulitis.  Given patient's significant fever and leukocytosis I believe she does warrant IV antibiotics and I'm going to place her in the cellulitis observation protocol for continued IV antibiotics overnight and tomorrow and reassessment of her laboratory studies and area of redness tomorrow.  I did mark the area of cellulitis with a marking pen here in the emergency department.  Patient's elevated heart rate is likely related to her significant fever of 103.  Her heart rate is significantly improved with fluids and Tylenol here.  Patient will receive vancomycin for antibiotics.  We'll continue her on IV fluids.  She'll have a repeat CBC in the morning and continued doses of antibiotics tomorrow.        Nat Christen, MD 02/05/12 0008  On reevaluation of the CDU cellulitis protocol criteria  patient is excluded due to her elevated leukocytosis.  I will contact the next unassigned team up for an admission for her cellulitis and abscess.  INCISION AND DRAINAGE Performed by: Emeline General A Consent: Verbal consent obtained. Risks  and benefits: risks, benefits and alternatives were discussed Type: abscess  Body area: back  Anesthesia: local infiltration  Local anesthetic: lidocaine 2% with epinephrine  Anesthetic total: 3 ml  Complexity: simple   Drainage: Only minimal purulent drainage was obtained   Drainage amount: minimal    Patient tolerance: Patient tolerated the procedure well with no immediate complications.     Nat Christen, MD 02/05/12 0012  Discussed with Dr. Toniann Fail, team 10, tele.   Nat Christen, MD 02/05/12 [phone]

## 2012-02-05 ENCOUNTER — Inpatient Hospital Stay (HOSPITAL_COMMUNITY): Payer: Self-pay

## 2012-02-05 ENCOUNTER — Encounter (HOSPITAL_COMMUNITY): Payer: Self-pay | Admitting: Internal Medicine

## 2012-02-05 DIAGNOSIS — L02219 Cutaneous abscess of trunk, unspecified: Principal | ICD-10-CM

## 2012-02-05 DIAGNOSIS — L03319 Cellulitis of trunk, unspecified: Secondary | ICD-10-CM

## 2012-02-05 DIAGNOSIS — E876 Hypokalemia: Secondary | ICD-10-CM

## 2012-02-05 DIAGNOSIS — L03312 Cellulitis of back [any part except buttock]: Secondary | ICD-10-CM | POA: Diagnosis present

## 2012-02-05 LAB — CBC WITH DIFFERENTIAL/PLATELET
Basophils Absolute: 0 10*3/uL (ref 0.0–0.1)
Eosinophils Relative: 1 % (ref 0–5)
Lymphocytes Relative: 17 % (ref 12–46)
MCV: 85.5 fL (ref 78.0–100.0)
Neutro Abs: 12.7 10*3/uL — ABNORMAL HIGH (ref 1.7–7.7)
Neutrophils Relative %: 73 % (ref 43–77)
Platelets: 227 10*3/uL (ref 150–400)
RDW: 12 % (ref 11.5–15.5)
WBC: 17.4 10*3/uL — ABNORMAL HIGH (ref 4.0–10.5)

## 2012-02-05 LAB — COMPREHENSIVE METABOLIC PANEL
Alkaline Phosphatase: 66 U/L (ref 39–117)
BUN: 5 mg/dL — ABNORMAL LOW (ref 6–23)
CO2: 24 mEq/L (ref 19–32)
Chloride: 109 mEq/L (ref 96–112)
Creatinine, Ser: 0.66 mg/dL (ref 0.50–1.10)
GFR calc Af Amer: >90 mL/min (ref >90)
GFR calc non Af Amer: >90 mL/min (ref >90)
Glucose, Bld: 126 mg/dL — ABNORMAL HIGH (ref 70–99)
Potassium: 3.2 mEq/L — ABNORMAL LOW (ref 3.5–5.1)
Total Bilirubin: 1 mg/dL (ref 0.3–1.2)

## 2012-02-05 LAB — PREGNANCY, URINE: Preg Test, Ur: NEGATIVE

## 2012-02-05 MED ORDER — ACETAMINOPHEN 650 MG RE SUPP: 650.0000 mg | Freq: Four times a day (QID) | RECTAL | Status: DC | PRN

## 2012-02-05 MED ORDER — POTASSIUM CHLORIDE CRYS ER 20 MEQ PO TBCR
40.0000 meq | EXTENDED_RELEASE_TABLET | Freq: Once | ORAL | Status: AC
  Administered 2012-02-05: 40 meq via ORAL
  Filled 2012-02-05: qty 2

## 2012-02-05 MED ORDER — IBUPROFEN 800 MG PO TABS
800.0000 mg | ORAL_TABLET | Freq: Three times a day (TID) | ORAL | Status: DC | PRN
Start: 2012-02-05 — End: 2012-02-05
  Filled 2012-02-05: qty 1

## 2012-02-05 MED ORDER — HYDROCODONE-ACETAMINOPHEN 5-325 MG PO TABS: 1.0000 | ORAL_TABLET | Freq: Four times a day (QID) | ORAL | Status: DC | PRN

## 2012-02-05 MED ORDER — ACETAMINOPHEN 325 MG PO TABS: 650.0000 mg | ORAL_TABLET | ORAL | Status: DC | PRN

## 2012-02-05 MED ORDER — SODIUM CHLORIDE 0.9 % IV SOLN: INTRAVENOUS | Status: DC

## 2012-02-05 MED ORDER — ONDANSETRON HCL 4 MG/2ML IJ SOLN: 4.0000 mg | Freq: Three times a day (TID) | INTRAMUSCULAR | Status: DC | PRN

## 2012-02-05 MED ORDER — VANCOMYCIN HCL IN DEXTROSE 1-5 GM/200ML-% IV SOLN
1000.0000 mg | Freq: Three times a day (TID) | INTRAVENOUS | Status: DC
  Filled 2012-02-05: qty 200

## 2012-02-05 MED ORDER — CEFEPIME HCL 2 G IJ SOLR
2.0000 g | Freq: Two times a day (BID) | INTRAMUSCULAR | Status: DC
  Administered 2012-02-05 – 2012-02-09 (×8): 2 g via INTRAVENOUS
  Filled 2012-02-05 (×9): qty 2

## 2012-02-05 MED ORDER — VANCOMYCIN HCL IN DEXTROSE 1-5 GM/200ML-% IV SOLN
1000.0000 mg | Freq: Three times a day (TID) | INTRAVENOUS | Status: DC
  Administered 2012-02-05 – 2012-02-09 (×13): 1000 mg via INTRAVENOUS
  Filled 2012-02-05 (×15): qty 200

## 2012-02-05 MED ORDER — SODIUM CHLORIDE 0.9 % IJ SOLN
3.0000 mL | Freq: Two times a day (BID) | INTRAMUSCULAR | Status: DC
  Administered 2012-02-05 – 2012-02-09 (×3): 3 mL via INTRAVENOUS

## 2012-02-05 MED ORDER — ONDANSETRON HCL 4 MG PO TABS: 4.0000 mg | ORAL_TABLET | Freq: Four times a day (QID) | ORAL | Status: DC | PRN

## 2012-02-05 MED ORDER — IOHEXOL 300 MG/ML  SOLN
80.0000 mL | Freq: Once | INTRAMUSCULAR | Status: AC | PRN
  Administered 2012-02-05: 80 mL via INTRAVENOUS

## 2012-02-05 MED ORDER — DIPHENHYDRAMINE HCL 50 MG/ML IJ SOLN
INTRAMUSCULAR | Status: AC
  Filled 2012-02-05: qty 1

## 2012-02-05 MED ORDER — ACETAMINOPHEN 325 MG PO TABS
650.0000 mg | ORAL_TABLET | Freq: Four times a day (QID) | ORAL | Status: DC | PRN
  Administered 2012-02-05: 650 mg via ORAL
  Filled 2012-02-05 (×2): qty 2

## 2012-02-05 MED ORDER — DIPHENHYDRAMINE HCL 50 MG/ML IJ SOLN
25.0000 mg | Freq: Three times a day (TID) | INTRAMUSCULAR | Status: AC
  Administered 2012-02-05 (×3): 25 mg via INTRAVENOUS
  Filled 2012-02-05 (×3): qty 0.5
  Filled 2012-02-05 (×2): qty 1

## 2012-02-05 MED ORDER — ACETAMINOPHEN 325 MG PO TABS
650.0000 mg | ORAL_TABLET | ORAL | Status: DC | PRN
  Administered 2012-02-05 – 2012-02-06 (×4): 650 mg via ORAL
  Filled 2012-02-05 (×3): qty 2

## 2012-02-05 MED ORDER — POTASSIUM CHLORIDE IN NACL 20-0.9 MEQ/L-% IV SOLN
INTRAVENOUS | Status: DC
  Administered 2012-02-05: 06:00:00 via INTRAVENOUS
  Filled 2012-02-05 (×4): qty 1000

## 2012-02-05 MED ORDER — HYDROCODONE-ACETAMINOPHEN 5-325 MG PO TABS
1.0000 | ORAL_TABLET | ORAL | Status: DC | PRN
  Administered 2012-02-05 – 2012-02-06 (×3): 1 via ORAL
  Filled 2012-02-05 (×3): qty 1

## 2012-02-05 MED ORDER — ACETAMINOPHEN 650 MG RE SUPP: 650.0000 mg | RECTAL | Status: DC | PRN

## 2012-02-05 MED ORDER — ONDANSETRON HCL 4 MG/2ML IJ SOLN
4.0000 mg | Freq: Four times a day (QID) | INTRAMUSCULAR | Status: DC | PRN
  Administered 2012-02-06 – 2012-02-08 (×5): 4 mg via INTRAVENOUS
  Filled 2012-02-05 (×6): qty 2

## 2012-02-05 MED ORDER — DEXTROSE 5 % IV SOLN
2.0000 g | Freq: Once | INTRAVENOUS | Status: AC
  Administered 2012-02-05: 2 g via INTRAVENOUS
  Filled 2012-02-05: qty 2

## 2012-02-05 MED ORDER — DIPHENHYDRAMINE HCL 50 MG/ML IJ SOLN
25.0000 mg | Freq: Once | INTRAMUSCULAR | Status: AC
  Administered 2012-02-05: 25 mg via INTRAVENOUS

## 2012-02-05 MED ORDER — MORPHINE SULFATE 2 MG/ML IJ SOLN
1.0000 mg | INTRAMUSCULAR | Status: DC | PRN
  Administered 2012-02-05 (×3): 1 mg via INTRAVENOUS
  Filled 2012-02-05 (×3): qty 1

## 2012-02-05 NOTE — Progress Notes (Signed)
[facility] PROGRESS NOTE  Danielle Spence GEX:[id number] DOB: [birth year] DOA: 02/04/2012 PCP: Sheila Oats, MD  Assessment/Plan: 1. Cellulitis upper back: Erythema much decreased by comparison with previous borders. Still quite tender. Suspect staph and a developing abscess even though CT negative for abscess. Appreciate surgical evaluation. Consider narrowing to monotherapy vancomycin based on clinical course. 2. Hypokalemia: Replete.  Code Status: Full code Family Communication: None at bedside Disposition Plan: Home when improved  Brendia Sacks, MD  [facility] Pager [phone]. If 7PM-7AM, please contact night-coverage at [email or web address], password [facility] 02/05/2012, 9:47 AM  LOS: 1 day   Brief narrative: 26 year old woman presented with back infection.  Consultants:  General surgery  Procedures:    Antibiotics:  Cefepime  Vancomycin  HPI/Subjective: Afebrile since 8 PM last night. Tachycardia resolved.  Objective: Filed Vitals:   02/04/12 2008 02/04/12 2339 02/05/12 0300  BP: 122/74 102/49 103/57  Pulse: 140  88  Temp: 103 F (39.4 C) 98.5 F (36.9 C) 98 F (36.7 C)  TempSrc: Oral Oral Oral  Resp: 16 18 18   Height:   5\' 1"  (1.549 m)  Weight:   76.2 kg (167 lb 15.9 oz)  SpO2: 99% 100% 94%   No intake or output data in the 24 hours ending 02/05/12 0947 Wt Readings from Last 3 Encounters:  02/05/12 76.2 kg (167 lb 15.9 oz)  10/06/11 77.111 kg (170 lb)    Exam:   General:  Appears calm and comfortable.  Cardiovascular: Regular rate and rhythm. No murmur, rub, gallop.  Telemetry: Sinus rhythm.  Respiratory: Clear to auscultation bilaterally. No wheezes, rales, rhonchi. Normal respiratory effort.  Back: Area of exquisite tenderness, induration and erythema mid back. Orders drawn previously to mark the extent of cellulitis suggest marked progression.  Data Reviewed: Basic Metabolic Panel:  Lab 02/05/12 3664 02/04/12 2035  NA 139 136  K  3.2* 2.8*  CL 109 99  CO2 24 21  GLUCOSE 126* 178*  BUN 5* 5*  CREATININE 0.66 0.75  CALCIUM 8.2* 9.1  MG -- --  PHOS -- --   Liver Function Tests:  Lab 02/05/12 0600 02/04/12 2035  AST 20 16  ALT 19 17  ALKPHOS 66 80  BILITOT 1.0 1.1  PROT 6.1 7.9  ALBUMIN 2.5* 3.4*   CBC:  Lab 02/05/12 0600 02/04/12 2035  WBC 17.4* 26.5*  NEUTROABS 12.7* 22.8*  HGB 11.7* 13.9  HCT 33.6* 40.6  MCV 85.5 85.1  PLT 227 309   Studies: No results found.  Scheduled Meds:   . sodium chloride   Intravenous Once  . acetaminophen  650 mg Oral Once  . ceFEPime (MAXIPIME) IV  2 g Intravenous Once  . ceFEPime (MAXIPIME) IV  2 g Intravenous Q12H  . diphenhydrAMINE      . diphenhydrAMINE  25 mg Intravenous Once  . diphenhydrAMINE  25 mg Intravenous Q8H  . HYDROcodone-acetaminophen  1 tablet Oral Once  .  morphine injection  4 mg Intravenous Once  . ondansetron (ZOFRAN) IV  4 mg Intravenous Once  . potassium chloride  40 mEq Oral Once  . sodium chloride  1,000 mL Intravenous Once  . sodium chloride  1,000 mL Intravenous Once  . sodium chloride  3 mL Intravenous Q12H  . vancomycin  1,250 mg Intravenous To ER  . vancomycin  1,000 mg Intravenous Q8H  . DISCONTD: sodium chloride   Intravenous STAT  . DISCONTD: vancomycin  1,000 mg Intravenous Q8H   Continuous Infusions:   . 0.9 % NaCl with  KCl 20 mEq / L 125 mL/hr at 02/05/12 0541    Principal Problem:  *Cellulitis of back Active Problems:  Hypokalemia     Brendia Sacks, MD  [facility] Pager [phone]. If 7PM-7AM, please contact night-coverage at [email or web address], password [facility] 02/05/2012, 9:47 AM  LOS: 1 day   Time spent: 25 minutes

## 2012-02-05 NOTE — Progress Notes (Signed)
  Subjective: Up at bedside, reports increased discomfort in her back.   Objective: Vital signs in last 24 hours: Temp:  [98 F (36.7 C)-103 F (39.4 C)] 98 F (36.7 C) (07/30 0300) Pulse Rate:  [88-140] 88  (07/30 0300) Resp:  [16-18] 18  (07/30 0300) BP: (102-122)/(49-74) 103/57 mmHg (07/30 0300) SpO2:  [94 %-100 %] 94 % (07/30 0300) Weight:  [167 lb 15.9 oz (76.2 kg)] 167 lb 15.9 oz (76.2 kg) (07/30 0300)    Intake/Output from previous day:   Intake/Output this shift:    General appearance: alert, cooperative, appears stated age and no distress Back: area of erythema appears to be retreating from defined borders, but the swelling appears to be increasing into the lateral aspect of her back. ( this remains soft to palpation but is very tender.) The cellulitic (hardened aspect remains in the outlined border)  Lab Results:   Basename 02/05/12 0600 02/04/12 2035  WBC 17.4* 26.5*  HGB 11.7* 13.9  HCT 33.6* 40.6  PLT 227 309   BMET  Basename 02/05/12 0600 02/04/12 2035  NA 139 136  K 3.2* 2.8*  CL 109 99  CO2 24 21  GLUCOSE 126* 178*  BUN 5* 5*  CREATININE 0.66 0.75  CALCIUM 8.2* 9.1   PT/INR No results found for this basename: LABPROT:2,INR:2 in the last 72 hours ABG No results found for this basename: PHART:2,PCO2:2,PO2:2,HCO3:2 in the last 72 hours  Studies/Results: No results found.  Anti-infectives: Anti-infectives     Start     Dose/Rate Route Frequency Ordered Stop   02/05/12 1800   ceFEPIme (MAXIPIME) 2 g in dextrose 5 % 50 mL IVPB        2 g 100 mL/hr over 30 Minutes Intravenous Every 12 hours 02/05/12 0329     02/05/12 0800   vancomycin (VANCOCIN) IVPB 1000 mg/200 mL premix  Status:  Discontinued        1,000 mg 200 mL/hr over 60 Minutes Intravenous Every 8 hours 02/05/12 0324 02/05/12 0331   02/05/12 0800   vancomycin (VANCOCIN) IVPB 1000 mg/200 mL premix        1,000 mg 100 mL/hr over 120 Minutes Intravenous Every 8 hours 02/05/12 0331     02/05/12 0400   ceFEPIme (MAXIPIME) 2 g in dextrose 5 % 50 mL IVPB        2 g 100 mL/hr over 30 Minutes Intravenous  Once 02/05/12 0329 02/05/12 0523   02/04/12 2230   vancomycin (VANCOCIN) 1,250 mg in sodium chloride 0.9 % 250 mL IVPB        1,250 mg 166.7 mL/hr over 90 Minutes Intravenous To Emergency Dept 02/04/12 2153 02/05/12 0153          Assessment/Plan: s/p * No surgery found *  1. Cellulitis of the left upper and mid back. 2. CT of the soft tissue of her back to r/o abcess. 3. Continue with current ABX   LOS: 1 day    Danielle Spence 02/05/2012

## 2012-02-05 NOTE — Progress Notes (Signed)
The area on her back is discreet and indurated, of concern for abscess CT neg  for abscess Observe on abx today, may need I&D if not improved tomorrow  Mariella Saa MD, FACS  02/05/2012, 1:54 PM

## 2012-02-05 NOTE — H&P (Signed)
Danielle Spence is an 26 y.o. female.   Patient was seen and examined on February 05, 2012 at 2:30 AM. PCP - none. Chief Complaint: Upper back swelling and pain. HPI: 26 year-old female with history of bronchial asthma has been noticed increasing swelling of her right upper back over the last few days which has worsened. In the ER patient was found to be febrile and tachycardic. Patient also has significant leukocytosis. The ER physician tried to do I&D but was not of much help. Patient has been admitted for IV antibiotic therapy and further management. Patient states her last year she had a similar abscess in the thigh which was drained. Patient denies any chest pain or shortness of breath.  Past Medical History  Diagnosis Date  . PID (pelvic inflammatory disease)   . HPV (human papilloma virus) infection   . Asthma     History reviewed. No pertinent past surgical history.  History reviewed. No pertinent family history. Social History:  reports that she has never smoked. She does not have any smokeless tobacco history on file. She reports that she does not drink alcohol or use illicit drugs.  Allergies: No Known Allergies  Medications Prior to Admission  Medication Sig Dispense Refill  . acetaminophen (TYLENOL) 500 MG tablet Take 500 mg by mouth every 6 (six) hours as needed. For pain      . Multiple Vitamin (MULITIVITAMIN WITH MINERALS) TABS Take 1 tablet by mouth daily.        Results for orders placed during the hospital encounter of 02/04/12 (from the past 48 hour(s))  CBC WITH DIFFERENTIAL     Status: Abnormal   Collection Time   02/04/12  8:35 PM      Component Value Range Comment   WBC 26.5 (*) 4.0 - 10.5 K/uL    RBC 4.77  3.87 - 5.11 MIL/uL    Hemoglobin 13.9  12.0 - 15.0 g/dL    HCT 16.1  09.6 - 04.5 %    MCV 85.1  78.0 - 100.0 fL    MCH 29.1  26.0 - 34.0 pg    MCHC 34.2  30.0 - 36.0 g/dL    RDW 40.9  81.1 - 91.4 %    Platelets 309  150 - 400 K/uL    Neutrophils Relative 86  (*) 43 - 77 %    Lymphocytes Relative 8 (*) 12 - 46 %    Monocytes Relative 6  3 - 12 %    Eosinophils Relative 0  0 - 5 %    Basophils Relative 0  0 - 1 %    Neutro Abs 22.8 (*) 1.7 - 7.7 K/uL    Lymphs Abs 2.1  0.7 - 4.0 K/uL    Monocytes Absolute 1.6 (*) 0.1 - 1.0 K/uL    Eosinophils Absolute 0.0  0.0 - 0.7 K/uL    Basophils Absolute 0.0  0.0 - 0.1 K/uL    Smear Review PLATELETS APPEAR ADEQUATE     COMPREHENSIVE METABOLIC PANEL     Status: Abnormal   Collection Time   02/04/12  8:35 PM      Component Value Range Comment   Sodium 136  135 - 145 mEq/L    Potassium 2.8 (*) 3.5 - 5.1 mEq/L    Chloride 99  96 - 112 mEq/L    CO2 21  19 - 32 mEq/L    Glucose, Bld 178 (*) 70 - 99 mg/dL    BUN 5 (*) 6 -  23 mg/dL    Creatinine, Ser 4.09  0.50 - 1.10 mg/dL    Calcium 9.1  8.4 - 81.1 mg/dL    Total Protein 7.9  6.0 - 8.3 g/dL    Albumin 3.4 (*) 3.5 - 5.2 g/dL    AST 16  0 - 37 U/L    ALT 17  0 - 35 U/L    Alkaline Phosphatase 80  39 - 117 U/L    Total Bilirubin 1.1  0.3 - 1.2 mg/dL    GFR calc non Af Amer >90  >90 mL/min    GFR calc Af Amer >90  >90 mL/min    No results found.  Review of Systems  Constitutional: Positive for fever and chills.  HENT: Negative.   Eyes: Negative.   Respiratory: Negative.   Cardiovascular: Negative.   Gastrointestinal: Negative.   Genitourinary: Negative.   Musculoskeletal:       Swelling and pain in the middle back.  Skin: Negative.   Neurological: Negative.   Endo/Heme/Allergies: Negative.   Psychiatric/Behavioral: Negative.     Blood pressure 103/57, pulse 88, temperature 98 F (36.7 C), temperature source Oral, resp. rate 18, height 5\' 1"  (1.549 m), weight 76.2 kg (167 lb 15.9 oz), SpO2 94.00%. Physical Exam  Constitutional: She is oriented to person, place, and time. She appears well-developed and well-nourished. No distress.  HENT:  Head: Normocephalic and atraumatic.  Right Ear: External ear normal.  Left Ear: External ear normal.    Nose: Nose normal.  Mouth/Throat: Oropharynx is clear and moist. No oropharyngeal exudate.  Eyes: Conjunctivae are normal. Pupils are equal, round, and reactive to light. Right eye exhibits no discharge. Left eye exhibits no discharge. No scleral icterus.  Neck: Normal range of motion. Neck supple.  Cardiovascular:       Sinus tachycardia.  Respiratory: Effort normal and breath sounds normal. No respiratory distress. She has no wheezes. She has no rales.  GI: Soft. Bowel sounds are normal. She exhibits no distension. There is no tenderness. There is no rebound.  Musculoskeletal:       Swelling of the middle back up to 10 cm area.With mild discharge with a surrounding larger area of induration and tenderness.  Neurological: She is alert and oriented to person, place, and time.       Moves all extremities.  Skin: She is not diaphoretic.  Psychiatric: Her behavior is normal.     Assessment/Plan #1. Cellulitis of the upper back - patient has been started on vancomycin and also I have added cefepime. I have consulted surgery for further recommendations given the extent of cellulitis which may need further debridement. Patient did have a reaction to vancomycin with facial and upper chest redness I have discussed this about with the pharmacy, this probably is a red man reaction. Patient did not have any swelling of the tongue or any breathing difficulty. I have instructed pharmacy to give the vancomycin slowly and premedicate with Benadryl. #2. Hypokalemia - replace and recheck.  CODE STATUS - full code.  Danielle Spence N. 02/05/2012, 3:36 AM

## 2012-02-05 NOTE — Progress Notes (Signed)
ANTIBIOTIC CONSULT NOTE - INITIAL  Pharmacy Consult for vancomycin, cefepime Indication: cellulitis  No Known Allergies  Patient Measurements: Height: 5' 1.81" (157 cm) Weight: 167 lb 15.9 oz (76.2 kg) IBW/kg (Calculated) : 49.67   Vital Signs: Temp: 98.5 F (36.9 C) (07/29 2339) Temp src: Oral (07/29 2339) BP: 102/49 mmHg (07/29 2339) Pulse Rate: 140  (07/29 2008) Intake/Output from previous day:   Intake/Output from this shift:    Labs:  [facility] 02/04/12 2035  WBC 26.5*  HGB 13.9  PLT 309  LABCREA --  CREATININE 0.75   Estimated Creatinine Clearance: 103.2 ml/min (by C-G formula based on Cr of 0.75). No results found for this basename: VANCOTROUGH:2,VANCOPEAK:2,VANCORANDOM:2,GENTTROUGH:2,GENTPEAK:2,GENTRANDOM:2,TOBRATROUGH:2,TOBRAPEAK:2,TOBRARND:2,AMIKACINPEAK:2,AMIKACINTROU:2,AMIKACIN:2, in the last 72 hours   Microbiology: No results found for this or any previous visit (from the past 720 hour(s)).  Medical History: Past Medical History  Diagnosis Date  . PID (pelvic inflammatory disease)   . HPV (human papilloma virus) infection   . Asthma     Medications:  Scheduled:    . sodium chloride   Intravenous Once  . acetaminophen  650 mg Oral Once  . diphenhydrAMINE      . diphenhydrAMINE  25 mg Intravenous Once  . HYDROcodone-acetaminophen  1 tablet Oral Once  .  morphine injection  4 mg Intravenous Once  . ondansetron (ZOFRAN) IV  4 mg Intravenous Once  . potassium chloride  40 mEq Oral Once  . sodium chloride  1,000 mL Intravenous Once  . sodium chloride  1,000 mL Intravenous Once  . sodium chloride  3 mL Intravenous Q12H  . vancomycin  1,250 mg Intravenous To ER  . DISCONTD: sodium chloride   Intravenous STAT   Assessment: 26 yo female admitted with cellulitis. Pharmacy consulted to manage vancomycin and cefepime. 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 Will slowly infuse subsequent vancomycin doses and physician as ordered pre-dose diphenyhydramine x 3 doses.   Goal of Therapy:  Vancomycin trough level 10-15 mcg/ml  Plan:  1. Cefepime 2gm IV Q12H.   2. Vancomycin 1gm IV Q8H with each dose infused over 2 hours and pre-dose diphenhydramine.   Emeline Gins 02/05/2012,3:13 AM

## 2012-02-05 NOTE — Consult Note (Signed)
Reason for Consult:Pain and redness of the right upper back Referring Physician: [name] is an 26 y.o. female.  HPI: History of prior I & D of groin abscess about one year ago.  Now with spontaneous area of induration, swelling, and tenderness of the left upper to mid back associated with tachycardia and fever up to 103.  Past Medical History  Diagnosis Date  . PID (pelvic inflammatory disease)   . HPV (human papilloma virus) infection   . Asthma     History reviewed. No pertinent past surgical history.  History reviewed. No pertinent family history.  Social History:  reports that she has never smoked. She does not have any smokeless tobacco history on file. She reports that she does not drink alcohol or use illicit drugs.  Allergies: No Known Allergies  Medications: I have reviewed the patient's current medications.  Results for orders placed during the hospital encounter of 02/04/12 (from the past 48 hour(s))  CBC WITH DIFFERENTIAL     Status: Abnormal   Collection Time   02/04/12  8:35 PM      Component Value Range Comment   WBC 26.5 (*) 4.0 - 10.5 K/uL    RBC 4.77  3.87 - 5.11 MIL/uL    Hemoglobin 13.9  12.0 - 15.0 g/dL    HCT 84.6  96.2 - 95.2 %    MCV 85.1  78.0 - 100.0 fL    MCH 29.1  26.0 - 34.0 pg    MCHC 34.2  30.0 - 36.0 g/dL    RDW 84.1  32.4 - 40.1 %    Platelets 309  150 - 400 K/uL    Neutrophils Relative 86 (*) 43 - 77 %    Lymphocytes Relative 8 (*) 12 - 46 %    Monocytes Relative 6  3 - 12 %    Eosinophils Relative 0  0 - 5 %    Basophils Relative 0  0 - 1 %    Neutro Abs 22.8 (*) 1.7 - 7.7 K/uL    Lymphs Abs 2.1  0.7 - 4.0 K/uL    Monocytes Absolute 1.6 (*) 0.1 - 1.0 K/uL    Eosinophils Absolute 0.0  0.0 - 0.7 K/uL    Basophils Absolute 0.0  0.0 - 0.1 K/uL    Smear Review PLATELETS APPEAR ADEQUATE     COMPREHENSIVE METABOLIC PANEL     Status: Abnormal   Collection Time   02/04/12  8:35 PM      Component Value Range Comment   Sodium  136  135 - 145 mEq/L    Potassium 2.8 (*) 3.5 - 5.1 mEq/L    Chloride 99  96 - 112 mEq/L    CO2 21  19 - 32 mEq/L    Glucose, Bld 178 (*) 70 - 99 mg/dL    BUN 5 (*) 6 - 23 mg/dL    Creatinine, Ser 0.27  0.50 - 1.10 mg/dL    Calcium 9.1  8.4 - 25.3 mg/dL    Total Protein 7.9  6.0 - 8.3 g/dL    Albumin 3.4 (*) 3.5 - 5.2 g/dL    AST 16  0 - 37 U/L    ALT 17  0 - 35 U/L    Alkaline Phosphatase 80  39 - 117 U/L    Total Bilirubin 1.1  0.3 - 1.2 mg/dL    GFR calc non Af Amer >90  >90 mL/min    GFR calc Af Amer >90  >  90 mL/min     No results found.  ROS Blood pressure 103/57, pulse 88, temperature 98 F (36.7 C), temperature source Oral, resp. rate 18, height 5\' 1"  (1.549 m), weight 76.2 kg (167 lb 15.9 oz), SpO2 94.00%. Physical Exam  Vitals reviewed. Constitutional: She is oriented to person, place, and time. She appears well-developed and well-nourished.  HENT:  Head: Normocephalic and atraumatic.  Eyes: Conjunctivae are normal. Pupils are equal, round, and reactive to light.  Neck: Normal range of motion.  Respiratory:    GI: Soft. Bowel sounds are normal.  Musculoskeletal: Normal range of motion.  Neurological: She is alert and oriented to person, place, and time. She has normal reflexes.  Skin: Skin is warm and dry.  Psychiatric: She has a normal mood and affect.    Assessment/Plan: Cellulitis and induration of the right upper back. Since admission and receiving her antibiotic the patient's tachycardia and fever have resolved.  Although the area of question is still very tender, the amount of spreading cellulitis has  significantly decreased, and her constitutional symptoms have improved.  Would suggest imaging study to delineate this process further, either and ultrasound or a CT of the chest soft tissue.  [name] 02/05/2012, 3:56 AM

## 2012-02-06 ENCOUNTER — Inpatient Hospital Stay (HOSPITAL_COMMUNITY): Payer: MEDICAID | Admitting: Certified Registered Nurse Anesthetist

## 2012-02-06 ENCOUNTER — Encounter (HOSPITAL_COMMUNITY)
Admission: EM | Disposition: A | Discharge status: Home / Self Care | Payer: Self-pay | Source: Home / Self Care | Attending: Internal Medicine

## 2012-02-06 ENCOUNTER — Encounter (HOSPITAL_COMMUNITY): Payer: Self-pay | Admitting: Certified Registered Nurse Anesthetist

## 2012-02-06 ENCOUNTER — Other Ambulatory Visit: Payer: Self-pay

## 2012-02-06 DIAGNOSIS — L0291 Cutaneous abscess, unspecified: Secondary | ICD-10-CM

## 2012-02-06 HISTORY — PX: IRRIGATION AND DEBRIDEMENT ABSCESS: SHX5252

## 2012-02-06 LAB — CBC WITH DIFFERENTIAL/PLATELET
Basophils Absolute: 0 10*3/uL (ref 0.0–0.1)
Eosinophils Relative: 1 % (ref 0–5)
HCT: 34.9 % — ABNORMAL LOW (ref 36.0–46.0)
Hemoglobin: 12.1 g/dL (ref 12.0–15.0)
Lymphocytes Relative: 12 % (ref 12–46)
Lymphs Abs: 2.6 10*3/uL (ref 0.7–4.0)
MCV: 85.3 fL (ref 78.0–100.0)
Monocytes Absolute: 1.3 10*3/uL — ABNORMAL HIGH (ref 0.1–1.0)
Monocytes Relative: 6 % (ref 3–12)
Neutro Abs: 17 10*3/uL — ABNORMAL HIGH (ref 1.7–7.7)
RBC: 4.09 MIL/uL (ref 3.87–5.11)
RDW: 12.1 % (ref 11.5–15.5)
WBC: 21.1 10*3/uL — ABNORMAL HIGH (ref 4.0–10.5)

## 2012-02-06 LAB — VANCOMYCIN, TROUGH: Vancomycin Tr: 12 ug/mL (ref 10.0–20.0)

## 2012-02-06 SURGERY — IRRIGATION AND DEBRIDEMENT ABSCESS
Anesthesia: General | Site: Back | Laterality: Right | Wound class: Dirty or Infected

## 2012-02-06 MED ORDER — HYDROMORPHONE HCL PF 1 MG/ML IJ SOLN
1.0000 mg | INTRAMUSCULAR | Status: DC | PRN
  Administered 2012-02-06 (×2): 1 mg via INTRAVENOUS
  Filled 2012-02-06 (×2): qty 1

## 2012-02-06 MED ORDER — HYDROMORPHONE HCL PF 1 MG/ML IJ SOLN
INTRAMUSCULAR | Status: AC
  Filled 2012-02-06: qty 1

## 2012-02-06 MED ORDER — FENTANYL CITRATE 0.05 MG/ML IJ SOLN
INTRAMUSCULAR | Status: DC | PRN
  Administered 2012-02-06 (×3): 50 ug via INTRAVENOUS

## 2012-02-06 MED ORDER — HYDROCODONE-ACETAMINOPHEN 5-325 MG PO TABS
1.0000 | ORAL_TABLET | ORAL | Status: DC | PRN
  Administered 2012-02-07: 1 via ORAL
  Administered 2012-02-08 – 2012-02-09 (×4): 2 via ORAL
  Filled 2012-02-06 (×3): qty 2
  Filled 2012-02-06: qty 1
  Filled 2012-02-06: qty 2

## 2012-02-06 MED ORDER — HYDROMORPHONE HCL PF 1 MG/ML IJ SOLN
0.2500 mg | INTRAMUSCULAR | Status: DC | PRN
  Administered 2012-02-06 (×3): 0.5 mg via INTRAVENOUS

## 2012-02-06 MED ORDER — BUPIVACAINE-EPINEPHRINE PF 0.25-1:200000 % IJ SOLN
INTRAMUSCULAR | Status: AC
  Filled 2012-02-06: qty 30

## 2012-02-06 MED ORDER — SUCCINYLCHOLINE CHLORIDE 20 MG/ML IJ SOLN
INTRAMUSCULAR | Status: DC | PRN
  Administered 2012-02-06: 180 mg via INTRAVENOUS

## 2012-02-06 MED ORDER — LACTATED RINGERS IV SOLN
INTRAVENOUS | Status: DC | PRN
  Administered 2012-02-06: 15:00:00 via INTRAVENOUS

## 2012-02-06 MED ORDER — MIDAZOLAM HCL 5 MG/5ML IJ SOLN
INTRAMUSCULAR | Status: DC | PRN
  Administered 2012-02-06: 2 mg via INTRAVENOUS

## 2012-02-06 MED ORDER — ONDANSETRON HCL 4 MG/2ML IJ SOLN: 4.0000 mg | Freq: Once | INTRAMUSCULAR | Status: AC | PRN

## 2012-02-06 MED ORDER — PROPOFOL 10 MG/ML IV EMUL
INTRAVENOUS | Status: DC | PRN
  Administered 2012-02-06: 200 mg via INTRAVENOUS

## 2012-02-06 MED ORDER — HYDROMORPHONE HCL PF 1 MG/ML IJ SOLN
1.0000 mg | INTRAMUSCULAR | Status: DC | PRN
  Administered 2012-02-06: 1 mg via INTRAVENOUS
  Administered 2012-02-07: 2 mg via INTRAVENOUS
  Administered 2012-02-07 (×2): 1 mg via INTRAVENOUS
  Administered 2012-02-07: 2 mg via INTRAVENOUS
  Administered 2012-02-08 (×3): 1 mg via INTRAVENOUS
  Filled 2012-02-06: qty 2
  Filled 2012-02-06 (×5): qty 1
  Filled 2012-02-06: qty 2
  Filled 2012-02-06: qty 1

## 2012-02-06 MED ORDER — LIDOCAINE HCL (CARDIAC) 20 MG/ML IV SOLN
INTRAVENOUS | Status: DC | PRN
  Administered 2012-02-06: 80 mg via INTRAVENOUS

## 2012-02-06 MED ORDER — SODIUM CHLORIDE 0.9 % IV SOLN
INTRAVENOUS | Status: DC
  Administered 2012-02-06 – 2012-02-08 (×4): via INTRAVENOUS

## 2012-02-06 MED ORDER — ONDANSETRON HCL 4 MG/2ML IJ SOLN
INTRAMUSCULAR | Status: DC | PRN
  Administered 2012-02-06: 4 mg via INTRAVENOUS

## 2012-02-06 SURGICAL SUPPLY — 34 items
BLADE SURG 10 STRL SS (BLADE) ×2 IMPLANT
BLADE SURG 15 STRL LF DISP TIS (BLADE) ×1 IMPLANT
BLADE SURG 15 STRL SS (BLADE) ×2
BLADE SURG ROTATE 9660 (MISCELLANEOUS) IMPLANT
CANISTER SUCTION 2500CC (MISCELLANEOUS) ×2 IMPLANT
CHLORAPREP W/TINT 26ML (MISCELLANEOUS) IMPLANT
CLOTH BEACON ORANGE TIMEOUT ST (SAFETY) ×2 IMPLANT
COVER SURGICAL LIGHT HANDLE (MISCELLANEOUS) ×4 IMPLANT
DRAPE LAPAROSCOPIC ABDOMINAL (DRAPES) ×2 IMPLANT
DRAPE UTILITY 15X26 W/TAPE STR (DRAPE) ×4 IMPLANT
ELECT CAUTERY BLADE 6.4 (BLADE) ×2 IMPLANT
ELECT REM PT RETURN 9FT ADLT (ELECTROSURGICAL) ×2
ELECTRODE REM PT RTRN 9FT ADLT (ELECTROSURGICAL) ×1 IMPLANT
GLOVE BIOGEL PI IND STRL 8 (GLOVE) ×1 IMPLANT
GLOVE BIOGEL PI INDICATOR 8 (GLOVE) ×1
GLOVE SURG SS PI 7.5 STRL IVOR (GLOVE) ×2 IMPLANT
GOWN STRL NON-REIN LRG LVL3 (GOWN DISPOSABLE) ×2 IMPLANT
GOWN STRL REIN XL XLG (GOWN DISPOSABLE) ×2 IMPLANT
KIT BASIN OR (CUSTOM PROCEDURE TRAY) ×2 IMPLANT
KIT ROOM TURNOVER OR (KITS) ×2 IMPLANT
NS IRRIG 1000ML POUR BTL (IV SOLUTION) ×2 IMPLANT
PACK SURGICAL SETUP 50X90 (CUSTOM PROCEDURE TRAY) ×2 IMPLANT
PAD ARMBOARD 7.5X6 YLW CONV (MISCELLANEOUS) ×4 IMPLANT
PENCIL BUTTON HOLSTER BLD 10FT (ELECTRODE) ×2 IMPLANT
SPONGE GAUZE 4X4 12PLY (GAUZE/BANDAGES/DRESSINGS) ×2 IMPLANT
SPONGE LAP 18X18 X RAY DECT (DISPOSABLE) ×2 IMPLANT
SWAB COLLECTION DEVICE MRSA (MISCELLANEOUS) ×1 IMPLANT
SYR BULB IRRIGATION 50ML (SYRINGE) ×2 IMPLANT
TOWEL OR 17X24 6PK STRL BLUE (TOWEL DISPOSABLE) ×2 IMPLANT
TOWEL OR 17X26 10 PK STRL BLUE (TOWEL DISPOSABLE) ×2 IMPLANT
TUBE ANAEROBIC SPECIMEN COL (MISCELLANEOUS) ×1 IMPLANT
TUBE CONNECTING 12X1/4 (SUCTIONS) ×2 IMPLANT
WATER STERILE IRR 1000ML POUR (IV SOLUTION) ×1 IMPLANT
YANKAUER SUCT BULB TIP NO VENT (SUCTIONS) ×2 IMPLANT

## 2012-02-06 NOTE — Op Note (Signed)
Preoperative Diagnosis: Cellulitis and abscess [682.9] fever  back abscess  Postoprative Diagnosis: Cellulitis and abscess [682.9] fever  back abscess  Procedure: Procedure(s): Incision, drainage and debridement of back abscess   Surgeon: Glenna Fellows T   Assistants: none  Anesthesia:  General endotracheal anesthesiaDiagnos  Indications:   Patient is a 26 year old female admitted with progressive pain and swelling in her mid back. She was initially felt to have cellulitis and was started on broad-spectrum IV antibiotics but failed to improve. Examination shows an approximately 15 x 8 cm irregular area of firm induration and cellulitis on her mid back. CT was negative for abscess but due to failure to improve I recommended proceeding with incision and drainage in the operating room under general anesthesia. We discussed the indications for the procedure as well as risks of bleeding and infection and anesthetic complications. She is in agreement and is brought to the operating room.  Procedure Detail:  Patient was placed on the operating table and general endotracheal anesthesia induced. She was carefully positioned in left lateral decubitus position well padded. She was already on broad-spectrum IV antibiotics. Her back was widely sterilely prepped and draped. Patient timeout was performed and correct procedure verified. I made a transverse incision across the center of the area of induration and in the deep subcutaneous tissue encountered a distinct abscess with thick purulent material. This was cultured. There were several loculations were broken up and there was some necrotic subcutaneous tissue that was debrided. There were extensions to the right posterior and left lateral aspect of the main abscess cavity which were opened up and widely drained with more purulent material obtained. I was satisfied that all loculations were broken up and the abscess well drained. The cavity was  irrigated with saline. Hemostasis was obtained with cautery. The soft tissue was infiltrated with Marcaine with epinephrine. The wound was packed with moist saline gauze and dressed with sterile ABDs. Sponge needle and isthmic counts were correct.   Estimated Blood Loss:  less than 50 mL         Drains: wound packed with moist saline gauze  Blood Given: none          Specimens: culture and sensitivity        Complications:  * No complications entered in OR log *         Disposition: PACU - hemodynamically stable.         Condition: stable  Mariella Saa MD, FACS  02/06/2012, 4:10 PM

## 2012-02-06 NOTE — Progress Notes (Signed)
Notified Craige Cotta, NP that patient's temp was 102.2 tonight gave tynenol 650mg , temp decreased to 101.2. Will continue to monitor patient. Nelda Marseille, RN

## 2012-02-06 NOTE — Progress Notes (Signed)
Patient Danielle Spence, 26 year old Asian female expresses concerns about her "back infection."  Patient expressed appreciation for Chaplain's provision of pastoral presence, prayer, and conversation.  I will follow-up as needed.

## 2012-02-06 NOTE — Progress Notes (Signed)
ANTIBIOTIC CONSULT NOTE - FOLLOW UP  Pharmacy Consult for vancomycin and cefepime Indication: cellulitis  No Known Allergies  Patient Measurements: Height: 5\' 1"  (154.9 cm) Weight: 167 lb 15.9 oz (76.2 kg)  IBW/kg (Calculated) : 47.8 kg Adjusted Body Weight: 59.2 kg  Vital Signs: Temp: 99.9 F (37.7 C) (07/31 0721) Temp src: Oral (07/31 0721) BP: 101/65 mmHg (07/31 0721) Pulse Rate: 105  (07/31 0721)  Labs:  Basename 02/06/12 0105 02/05/12 0600 02/04/12 2035  WBC 21.1* 17.4* 26.5*  HGB 12.1 11.7* 13.9  PLT 293 227 309  LABCREA -- -- --  CREATININE -- 0.66 0.75   Estimated Creatinine Clearance: 101.3 ml/min (by C-G formula based on Cr of 0.66).  Basename 02/06/12 0654  VANCOTROUGH 12.0  VANCOPEAK --  VANCORANDOM --  GENTTROUGH --  GENTPEAK --  GENTRANDOM --  TOBRATROUGH --  TOBRAPEAK --  TOBRARND --  AMIKACINPEAK --  AMIKACINTROU --  AMIKACIN --     Microbiology: Recent Results (from the past 720 hour(s))  CULTURE, BLOOD (ROUTINE X 2)     Status: Normal (Preliminary result)   Collection Time   02/04/12  8:40 PM      Component Value Range Status Comment   Specimen Description BLOOD ARM LEFT   Final    Special Requests BOTTLES DRAWN AEROBIC AND ANAEROBIC 10CC   Final    Culture  Setup Time 02/05/2012 01:46   Final    Culture     Final    Value:        BLOOD CULTURE RECEIVED NO GROWTH TO DATE CULTURE WILL BE HELD FOR 5 DAYS BEFORE ISSUING A FINAL NEGATIVE REPORT   Report Status PENDING   Incomplete   CULTURE, BLOOD (ROUTINE X 2)     Status: Normal (Preliminary result)   Collection Time   02/04/12  8:45 PM      Component Value Range Status Comment   Specimen Description BLOOD ARM LEFT   Final    Special Requests BOTTLES DRAWN AEROBIC AND ANAEROBIC 10CC   Final    Culture  Setup Time 02/05/2012 01:46   Final    Culture     Final    Value:        BLOOD CULTURE RECEIVED NO GROWTH TO DATE CULTURE WILL BE HELD FOR 5 DAYS BEFORE ISSUING A FINAL NEGATIVE REPORT   Report Status PENDING   Incomplete     Anti-infectives     Start     Dose/Rate Route Frequency Ordered Stop   02/05/12 1800   ceFEPIme (MAXIPIME) 2 g in dextrose 5 % 50 mL IVPB        2 g 100 mL/hr over 30 Minutes Intravenous Every 12 hours 02/05/12 0329     02/05/12 0800   vancomycin (VANCOCIN) IVPB 1000 mg/200 mL premix  Status:  Discontinued        1,000 mg 200 mL/hr over 60 Minutes Intravenous Every 8 hours 02/05/12 0324 02/05/12 0331   02/05/12 0800   vancomycin (VANCOCIN) IVPB 1000 mg/200 mL premix        1,000 mg 100 mL/hr over 120 Minutes Intravenous Every 8 hours 02/05/12 0331     02/05/12 0400   ceFEPIme (MAXIPIME) 2 g in dextrose 5 % 50 mL IVPB        2 g 100 mL/hr over 30 Minutes Intravenous  Once 02/05/12 0329 02/05/12 0523   02/04/12 2230   vancomycin (VANCOCIN) 1,250 mg in sodium chloride 0.9 % 250 mL IVPB  1,250 mg 166.7 mL/hr over 90 Minutes Intravenous To Emergency Dept 02/04/12 2153 02/05/12 0153          Assessment: 25 YOF admitted with cellulitis on back. Pharmacy consulted to manage vancomycin and cefepime. 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 Vancomycin infusion rate has been slowed due to this and physician had ordered pre-dose diphenhydramine x3 doses. She is scheduled for irrigation and debridement of abscess today at 1330. Vancomycin trough this morning was 12 which was within goal.  Goal of Therapy:  Vancomycin trough level 10-15 mcg/ml  Plan:  1. Continue vancomycin 1gm IV Q8hr infused over 2 hours 2. Will discuss with nurse whether to continue pre-dose diphenhydramine and will order if appropriate. 3. Continue cefepime 2gm IV Q12hr 4. Follow up with cultures, troughs as needed, and discharge planning.  Knute Mazzuca D. Tyshaun Vinzant, PharmD Clinical Pharmacist Pager: [phone] Phone: [phone] 02/06/2012 12:06 PM

## 2012-02-06 NOTE — Transfer of Care (Signed)
Immediate Anesthesia Transfer of Care Note  Patient: Danielle Spence  Procedure(s) Performed: Procedure(s) (LRB): IRRIGATION AND DEBRIDEMENT ABSCESS (Right)  Patient Location: PACU  Anesthesia Type: General  Level of Consciousness: sedated and patient cooperative  Airway & Oxygen Therapy: Patient Spontanous Breathing and Patient connected to nasal cannula oxygen  Post-op Assessment: Report given to PACU RN, Post -op Vital signs reviewed and stable and Patient moving all extremities X 4  Post vital signs: Reviewed and stable  Complications: No apparent anesthesia complications

## 2012-02-06 NOTE — Progress Notes (Signed)
1800 Patient returned to floor floor from surgery. Ambulated to bathroom with assist and voided large amount of urine. Dsg intact stain marked.

## 2012-02-06 NOTE — Preoperative (Signed)
Beta Blockers   Reason not to administer Beta Blockers:Not Applicable 

## 2012-02-06 NOTE — Progress Notes (Signed)
Patient ID: Danielle Spence, female   DOB: 12-Sep-1985, 26 y.o.   MRN: [id number]    Subjective: Pain is unchanged in back at site of infection.  Vomited once this AM  Objective: Vital signs in last 24 hours: Temp:  [98.7 F (37.1 C)-103.9 F (39.9 C)] 99.9 F (37.7 C) (07/31 0721) Pulse Rate:  [96-110] 105  (07/31 0721) Resp:  [18-20] 18  (07/31 0721) BP: (92-104)/(61-68) 101/65 mmHg (07/31 0721) SpO2:  [96 %-100 %] 96 % (07/31 0721)    Intake/Output from previous day:   Intake/Output this shift:    General appearance: alert and mild distress Chest wall: Induration, tenderness and erythema mid back no change  Lab Results:   Basename 02/06/12 0105 02/05/12 0600  WBC 21.1* 17.4*  HGB 12.1 11.7*  HCT 34.9* 33.6*  PLT 293 227   BMET  Basename 02/05/12 0600 02/04/12 2035  NA 139 136  K 3.2* 2.8*  CL 109 99  CO2 24 21  GLUCOSE 126* 178*  BUN 5* 5*  CREATININE 0.66 0.75  CALCIUM 8.2* 9.1     Studies/Results: Ct Chest W Contrast  02/05/2012  *RADIOLOGY REPORT*  Clinical Data: Cellulitis on the back  CT CHEST WITH CONTRAST  Technique:  Multidetector CT imaging of the chest was performed following the standard protocol during bolus administration of intravenous contrast.  Contrast: 80mL OMNIPAQUE IOHEXOL 300 MG/ML  SOLN  Comparison: None.  Findings: There is stranding within the deep and subcutaneous central fat of the back, from approximately the T71 level through the upper lumbar levels.  There is no abnormal soft tissue gas.  No focal fluid collections are identified to suggest abscess formation. The inflammatory changes extend to the paraspinous musculature but there is no evidence of muscular edema.  The central spinal canal has a normal appearance.  There is no axillary, mediastinal, or hilar adenopathy.  A 1.8 cm cystic structure is located in the right infrahilar region (middle mediastinum), likely a bronchogenic cyst.Both lungs are clear.  The osseous structures are  unremarkable. The visualized upper abdomen is unremarkable.  IMPRESSION: There is evidence of cellulitis within the adipose tissues of the back, most prominent centrally into the right of midline.  There is no subcutaneous emphysema or abscess formation, however.  Original Report Authenticated By: Brandon Melnick, M.D.    Anti-infectives: Anti-infectives     Start     Dose/Rate Route Frequency Ordered Stop   02/05/12 1800   ceFEPIme (MAXIPIME) 2 g in dextrose 5 % 50 mL IVPB        2 g 100 mL/hr over 30 Minutes Intravenous Every 12 hours 02/05/12 0329     02/05/12 0800   vancomycin (VANCOCIN) IVPB 1000 mg/200 mL premix  Status:  Discontinued        1,000 mg 200 mL/hr over 60 Minutes Intravenous Every 8 hours 02/05/12 0324 02/05/12 0331   02/05/12 0800   vancomycin (VANCOCIN) IVPB 1000 mg/200 mL premix        1,000 mg 100 mL/hr over 120 Minutes Intravenous Every 8 hours 02/05/12 0331     02/05/12 0400   ceFEPIme (MAXIPIME) 2 g in dextrose 5 % 50 mL IVPB        2 g 100 mL/hr over 30 Minutes Intravenous  Once 02/05/12 0329 02/05/12 0523   02/04/12 2230   vancomycin (VANCOCIN) 1,250 mg in sodium chloride 0.9 % 250 mL IVPB        1,250 mg 166.7 mL/hr over 90 Minutes  Intravenous To Emergency Dept 02/04/12 2153 02/05/12 0153          Assessment/Plan: Despite CT results she is no better, WBC increasing.  Will plan to go ahead with I&D today under general anesthesia.  Nature of surgery and risks discussed and understood and she agrees.    LOS: 2 days    Calden Dorsey T 02/06/2012

## 2012-02-06 NOTE — Progress Notes (Signed)
[facility] PROGRESS NOTE  Danielle Spence WUJ:[id number] DOB: [birth year] DOA: 02/04/2012 PCP: Sheila Oats, MD  Assessment/Plan: 1. Cellulitis upper back: Erythema much decreased by comparison with previous borders, even more [place] today. Still quite tender. Suspect staph and a developing abscess even though CT negative for abscess. Appreciate surgical evaluation and patient is status post I&D by surgery today 2. Hypokalemia: Replete.  Code Status: Full code Family Communication: None at bedside Disposition Plan: Home when improved  Brendia Sacks, MD  [facility] Pager [phone]. If 7PM-7AM, please contact night-coverage at [email or web address], password [facility] 02/06/2012, 5:18 PM  LOS: 2 days   Brief narrative: 26 year old woman presented with back infection.  Consultants:  General surgery  Procedures:  Antibiotics:  Cefepime day 2  Vancomycin day 2  HPI/Subjective: Patient feeling a little bit better. Still some back pain but not as severe.  Objective: Filed Vitals:   02/06/12 1653 02/06/12 1700 02/06/12 1708 02/06/12 1715  BP: 102/62  97/47   Pulse: 124 123 122 121  Temp:    100.8 F (38.2 C)  TempSrc:      Resp: 22 21 19 21   Height:      Weight:      SpO2: 100% 99% 99% 100%    Intake/Output Summary (Last 24 hours) at 02/06/12 1718 Last data filed at 02/06/12 1601  Gross per 24 hour  Intake    800 ml  Output      0 ml  Net    800 ml   Wt Readings from Last 3 Encounters:  02/05/12 76.2 kg (167 lb 15.9 oz)  02/05/12 76.2 kg (167 lb 15.9 oz)  10/06/11 77.111 kg (170 lb)    Exam:   General:  Appears calm and comfortable.  Cardiovascular: Mild tachycardia, no murmurs rubs or gallops.  Respiratory: Clear to auscultation bilaterally. No wheezes, rales, rhonchi. Normal respiratory effort.  Back: Area of moderate tenderness, induration and erythema mid back. Orders drawn previously to mark the extent of cellulitis suggest marked progression.  Data  Reviewed: Basic Metabolic Panel:  Lab 02/05/12 8657 02/04/12 2035  NA 139 136  K 3.2* 2.8*  CL 109 99  CO2 24 21  GLUCOSE 126* 178*  BUN 5* 5*  CREATININE 0.66 0.75  CALCIUM 8.2* 9.1  MG -- --  PHOS -- --   Liver Function Tests:  Lab 02/05/12 0600 02/04/12 2035  AST 20 16  ALT 19 17  ALKPHOS 66 80  BILITOT 1.0 1.1  PROT 6.1 7.9  ALBUMIN 2.5* 3.4*   CBC:  Lab 02/06/12 0105 02/05/12 0600 02/04/12 2035  WBC 21.1* 17.4* 26.5*  NEUTROABS 17.0* 12.7* 22.8*  HGB 12.1 11.7* 13.9  HCT 34.9* 33.6* 40.6  MCV 85.3 85.5 85.1  PLT 293 227 309   Studies: No results found.  Scheduled Meds:    . ceFEPime (MAXIPIME) IV  2 g Intravenous Q12H  . diphenhydrAMINE  25 mg Intravenous Q8H  . HYDROmorphone      . HYDROmorphone      . sodium chloride  3 mL Intravenous Q12H  . vancomycin  1,000 mg Intravenous Q8H   Continuous Infusions:    . sodium chloride 100 mL/hr at 02/06/12 0039      Brendia Sacks, MD  [facility] Pager [phone]. If 7PM-7AM, please contact night-coverage at [email or web address], password [facility] 02/06/2012, 5:18 PM  LOS: 2 days   Time spent: 25 minutes

## 2012-02-06 NOTE — Care Management Note (Unsigned)
    Page 1 of 1   02/06/2012     11:22:00 AM   CARE MANAGEMENT NOTE 02/06/2012  Patient:  Danielle Spence, Danielle Spence   Account Number:  [id number]  Date Initiated:  02/06/2012  Documentation initiated by:  Letha Cape  Subjective/Objective Assessment:   dx cellulits of back  admit- lives with her cousin.  pta independent.     Action/Plan:   Anticipated DC Date:  02/07/2012   Anticipated DC Plan:  HOME/SELF CARE      DC Planning Services  CM consult      Choice offered to / List presented to:             Status of service:  In process, will continue to follow Medicare Important Message given?   (If response is "NO", the following Medicare IM given date fields will be blank) Date Medicare IM given:   Date Additional Medicare IM given:    Discharge Disposition:    Per UR Regulation:  Reviewed for med. necessity/level of care/duration of stay  If discussed at Long Length of Stay Meetings, dates discussed:    Comments:  PCP Jovita Kussmaul  02/06/12 11:19 Letha Cape RN, BSN [phone] patient lives with cousin, pta independent.  Patient is eligible for med ast if needed but she states she can afford meds for $4 on [employer] List.  Patient has transportation at dc. NCM will continue to follow for dc needs.

## 2012-02-06 NOTE — Anesthesia Preprocedure Evaluation (Addendum)
Anesthesia Evaluation  Patient identified by MRN, date of birth, ID band Patient awake    Reviewed: Allergy & Precautions, H&P , NPO status , Patient's Chart, lab work & pertinent test results  Airway Mallampati: I TM Distance: >3 FB Neck ROM: Full    Dental  (+) Teeth Intact and Dental Advisory Given   Pulmonary asthma ,  breath sounds clear to auscultation        Cardiovascular Rhythm:Regular Rate:Normal     Neuro/Psych    GI/Hepatic   Endo/Other  Morbid obesity  Renal/GU      Musculoskeletal   Abdominal   Peds  Hematology   Anesthesia Other Findings Uses inhaler rarely.  Reproductive/Obstetrics                           Anesthesia Physical Anesthesia Plan  ASA: II  Anesthesia Plan: General   Post-op Pain Management:    Induction: Intravenous  Airway Management Planned: Oral ETT and LMA  Additional Equipment:   Intra-op Plan:   Post-operative Plan:   Informed Consent: I have reviewed the patients History and Physical, chart, labs and discussed the procedure including the risks, benefits and alternatives for the proposed anesthesia with the patient or authorized representative who has indicated his/her understanding and acceptance.   Dental advisory given  Plan Discussed with: CRNA, Anesthesiologist and Surgeon  Anesthesia Plan Comments:         Anesthesia Quick Evaluation

## 2012-02-06 NOTE — Anesthesia Postprocedure Evaluation (Signed)
  Anesthesia Post-op Note  Patient: Danielle Spence  Procedure(s) Performed: Procedure(s) (LRB): IRRIGATION AND DEBRIDEMENT ABSCESS (Right)  Patient Location: PACU  Anesthesia Type: General  Level of Consciousness: awake, alert  and oriented  Airway and Oxygen Therapy: Patient Spontanous Breathing and Patient connected to nasal cannula oxygen  Post-op Pain: mild  Post-op Assessment: Post-op Vital signs reviewed  Post-op Vital Signs: Reviewed  Complications: No apparent anesthesia complications

## 2012-02-07 ENCOUNTER — Encounter (HOSPITAL_COMMUNITY): Payer: Self-pay | Admitting: General Surgery

## 2012-02-07 LAB — GLUCOSE, CAPILLARY: Glucose-Capillary: 96 mg/dL (ref 70–99)

## 2012-02-07 LAB — BASIC METABOLIC PANEL
BUN: 4 mg/dL — ABNORMAL LOW (ref 6–23)
Chloride: 104 mEq/L (ref 96–112)
Creatinine, Ser: 0.61 mg/dL (ref 0.50–1.10)
GFR calc Af Amer: >90 mL/min (ref >90)
GFR calc non Af Amer: >90 mL/min (ref >90)
Potassium: 3.2 mEq/L — ABNORMAL LOW (ref 3.5–5.1)

## 2012-02-07 LAB — CBC
HCT: 37.1 % (ref 36.0–46.0)
MCHC: 34.2 g/dL (ref 30.0–36.0)
Platelets: 299 10*3/uL (ref 150–400)
RDW: 11.9 % (ref 11.5–15.5)
WBC: 14.7 10*3/uL — ABNORMAL HIGH (ref 4.0–10.5)

## 2012-02-07 MED ORDER — PROMETHAZINE HCL 25 MG/ML IJ SOLN
12.5000 mg | Freq: Once | INTRAMUSCULAR | Status: AC
  Administered 2012-02-07: 12.5 mg via INTRAVENOUS
  Filled 2012-02-07: qty 1

## 2012-02-07 NOTE — Progress Notes (Signed)
1 Day Post-Op  Subjective: Sitting up in bed, pain is well controlled. Patient states that she feels better.  Objective: Vital signs in last 24 hours: Temp:  [98.9 F (37.2 C)-102.8 F (39.3 C)] 98.9 F (37.2 C) (08/01 0557) Pulse Rate:  [95-125] 95  (08/01 0557) Resp:  [18-29] 20  (08/01 0557) BP: (97-112)/(47-72) 101/61 mmHg (08/01 0557) SpO2:  [94 %-100 %] 97 % (08/01 0557) Last BM Date: 02/04/12  Intake/Output from previous day: 07/31 0701 - 08/01 0700 In: 2086.7 [I.V.:1836.7; IV Piggyback:250] Out: 1 [Urine:1] Intake/Output this shift:    General appearance: alert, cooperative, appears stated age and no distress Back: Some drainage is seen on dressing, appropriately tender. WBC trending downward, afebrile, VSS. Lab Results:   Basename 02/07/12 0855 02/06/12 0105  WBC 14.7* 21.1*  HGB 12.7 12.1  HCT 37.1 34.9*  PLT 299 293   BMET  Basename 02/07/12 0855 02/05/12 0600  NA 140 139  K 3.2* 3.2*  CL 104 109  CO2 25 24  GLUCOSE 88 126*  BUN 4* 5*  CREATININE 0.61 0.66  CALCIUM 8.8 8.2*   PT/INR No results found for this basename: LABPROT:2,INR:2 in the last 72 hours ABG No results found for this basename: PHART:2,PCO2:2,PO2:2,HCO3:2 in the last 72 hours  Studies/Results: Ct Chest W Contrast  02/05/2012  *RADIOLOGY REPORT*  Clinical Data: Cellulitis on the back  CT CHEST WITH CONTRAST  Technique:  Multidetector CT imaging of the chest was performed following the standard protocol during bolus administration of intravenous contrast.  Contrast: 80mL OMNIPAQUE IOHEXOL 300 MG/ML  SOLN  Comparison: None.  Findings: There is stranding within the deep and subcutaneous central fat of the back, from approximately the T71 level through the upper lumbar levels.  There is no abnormal soft tissue gas.  No focal fluid collections are identified to suggest abscess formation. The inflammatory changes extend to the paraspinous musculature but there is no evidence of muscular edema.   The central spinal canal has a normal appearance.  There is no axillary, mediastinal, or hilar adenopathy.  A 1.8 cm cystic structure is located in the right infrahilar region (middle mediastinum), likely a bronchogenic cyst.Both lungs are clear.  The osseous structures are unremarkable. The visualized upper abdomen is unremarkable.  IMPRESSION: There is evidence of cellulitis within the adipose tissues of the back, most prominent centrally into the right of midline.  There is no subcutaneous emphysema or abscess formation, however.  Original Report Authenticated By: Brandon Melnick, M.D.    Anti-infectives: Anti-infectives     Start     Dose/Rate Route Frequency Ordered Stop   02/05/12 1800   ceFEPIme (MAXIPIME) 2 g in dextrose 5 % 50 mL IVPB        2 g 100 mL/hr over 30 Minutes Intravenous Every 12 hours 02/05/12 0329     02/05/12 0800   vancomycin (VANCOCIN) IVPB 1000 mg/200 mL premix  Status:  Discontinued        1,000 mg 200 mL/hr over 60 Minutes Intravenous Every 8 hours 02/05/12 0324 02/05/12 0331   02/05/12 0800   vancomycin (VANCOCIN) IVPB 1000 mg/200 mL premix        1,000 mg 100 mL/hr over 120 Minutes Intravenous Every 8 hours 02/05/12 0331     02/05/12 0400   ceFEPIme (MAXIPIME) 2 g in dextrose 5 % 50 mL IVPB        2 g 100 mL/hr over 30 Minutes Intravenous  Once 02/05/12 0329 02/05/12 0523  02/04/12 2230   vancomycin (VANCOCIN) 1,250 mg in sodium chloride 0.9 % 250 mL IVPB        1,250 mg 166.7 mL/hr over 90 Minutes Intravenous To Emergency Dept 02/04/12 2153 02/05/12 0153          Assessment/Plan: s/p Procedure(s) (LRB): IRRIGATION AND DEBRIDEMENT ABSCESS (Right) 1. Continue ABX 2. Plan to do first dressing change tomorrow 3. Check labs in am   LOS: 3 days    Sara Keys 02/07/2012

## 2012-02-07 NOTE — Progress Notes (Signed)
Patient it vomiting and Zofran is ordered for every 6 hours. It has only been 4 hours since last dose was given. Provider on call for [facility] was notified. A one time dose of phenergan was ordered and administered. Patient tolerated well. Madilyn Fireman Pritchett.

## 2012-02-07 NOTE — Progress Notes (Signed)
Patient interviewed and examined, agree with NP note above. Looks much better p I&D  Mariella Saa MD, FACS  02/07/2012 4:54 PM

## 2012-02-07 NOTE — Progress Notes (Signed)
[facility] PROGRESS NOTE  Danielle Spence WGN:[id number] DOB: 1985/11/06 DOA: 02/04/2012 PCP: Patient does not have a primary care physician  Assessment/Plan: 1. Cellulitis upper back: Patient has been on broad-spectrum antibiotics for 3 days now. She underwent I&D yesterday by surgery. Based on drawn is a borders of the erythema, it continues to dramatically improve with decreased swelling, erythema and tenderness. However last night, she still spiked a temperature of 102 even after I&D. Recommendation the surgeries to continue draining and repeat lab work in the morning. 2. Hypokalemia: Replete.  Code Status: Full code Family Communication: None at bedside Disposition Plan: Home when improved   Brief narrative: Patient is a 26 year old Asian female who was admitted on 7/29 for a back infection with significant erythema, swelling and tenderness.  Consultants:  General surgery  Procedures:  Antibiotics:  Cefepime day 3  Vancomycin day 3  HPI/Subjective: Patient feeling a little bit better. Still some back pain but not as severe.  Objective: Filed Vitals:   02/06/12 2210 02/07/12 0557 02/07/12 1105 02/07/12 1300  BP:  101/61  124/88  Pulse:  95  87  Temp: 101.2 F (38.4 C) 98.9 F (37.2 C) 98.7 F (37.1 C) 98.7 F (37.1 C)  TempSrc:  Oral Oral   Resp:  20  20  Height:      Weight:      SpO2:  97%  97%    Intake/Output Summary (Last 24 hours) at 02/07/12 1513 Last data filed at 02/07/12 0800  Gross per 24 hour  Intake 2326.67 ml  Output      1 ml  Net 2325.67 ml   Wt Readings from Last 3 Encounters:  02/05/12 76.2 kg (167 lb 15.9 oz)  02/05/12 76.2 kg (167 lb 15.9 oz)  10/06/11 77.111 kg (170 lb)    Exam:   General:  Appears calm and comfortable.  Cardiovascular: Mild tachycardia, no murmurs rubs or gallops.  Respiratory: Clear to auscultation bilaterally. No wheezes, rales, rhonchi. Normal respiratory effort.  Back: Her back is bandaged with some  significant amount of drainage soaking into the pads.  Data Reviewed: Basic Metabolic Panel:  Lab 02/07/12 7846 02/05/12 0600 02/04/12 2035  NA 140 139 136  K 3.2* 3.2* 2.8*  CL 104 109 99  CO2 25 24 21   GLUCOSE 88 126* 178*  BUN 4* 5* 5*  CREATININE 0.61 0.66 0.75  CALCIUM 8.8 8.2* 9.1  MG -- -- --  PHOS -- -- --   Liver Function Tests:  Lab 02/05/12 0600 02/04/12 2035  AST 20 16  ALT 19 17  ALKPHOS 66 80  BILITOT 1.0 1.1  PROT 6.1 7.9  ALBUMIN 2.5* 3.4*   CBC:  Lab 02/07/12 0855 02/06/12 0105 02/05/12 0600 02/04/12 2035  WBC 14.7* 21.1* 17.4* 26.5*  NEUTROABS -- 17.0* 12.7* 22.8*  HGB 12.7 12.1 11.7* 13.9  HCT 37.1 34.9* 33.6* 40.6  MCV 85.5 85.3 85.5 85.1  PLT 299 293 227 309   Studies: No results found.  Scheduled Meds:    . ceFEPime (MAXIPIME) IV  2 g Intravenous Q12H  . HYDROmorphone      . HYDROmorphone      . sodium chloride  3 mL Intravenous Q12H  . vancomycin  1,000 mg Intravenous Q8H   Continuous Infusions:    . sodium chloride 100 mL/hr at 02/07/12 9629      Virginia Rochester, MD  [facility] Pager [phone]. If 7PM-7AM, please contact night-coverage at [email or web address], password [facility] 02/07/2012, 3:13 PM  LOS: 3 days   Time spent: 25 minutes

## 2012-02-08 LAB — CBC
HCT: 34.3 % — ABNORMAL LOW (ref 36.0–46.0)
Hemoglobin: 11.6 g/dL — ABNORMAL LOW (ref 12.0–15.0)
MCHC: 33.8 g/dL (ref 30.0–36.0)
RDW: 12.1 % (ref 11.5–15.5)
WBC: 12 10*3/uL — ABNORMAL HIGH (ref 4.0–10.5)

## 2012-02-08 LAB — BASIC METABOLIC PANEL
BUN: 3 mg/dL — ABNORMAL LOW (ref 6–23)
Calcium: 8.9 mg/dL (ref 8.4–10.5)
GFR calc Af Amer: >90 mL/min (ref >90)
GFR calc non Af Amer: >90 mL/min (ref >90)
Glucose, Bld: 83 mg/dL (ref 70–99)

## 2012-02-08 MED ORDER — POTASSIUM CHLORIDE CRYS ER 20 MEQ PO TBCR
40.0000 meq | EXTENDED_RELEASE_TABLET | Freq: Two times a day (BID) | ORAL | Status: AC
  Administered 2012-02-08 (×2): 40 meq via ORAL
  Filled 2012-02-08 (×2): qty 2

## 2012-02-08 NOTE — Progress Notes (Signed)
Patient interviewed and examined, agree with NP note above. Much less erythema and induration around I&D site. Doing well.  Mariella Saa MD, FACS  02/08/2012 2:16 PM

## 2012-02-08 NOTE — Progress Notes (Signed)
ANTIBIOTIC CONSULT NOTE - FOLLOW UP  Pharmacy Consult for vancomycin and cefepime Indication: cellulitis  No Known Allergies  Labs:  Basename 02/08/12 0530 02/07/12 0855 02/06/12 0105  WBC 12.0* 14.7* 21.1*  HGB 11.6* 12.7 12.1  PLT 347 299 293  LABCREA -- -- --  CREATININE -- 0.61 --   Estimated Creatinine Clearance: 101.3 ml/min (by C-G formula based on Cr of 0.61).  Basename 02/06/12 0654  VANCOTROUGH 12.0  VANCOPEAK --  VANCORANDOM --  GENTTROUGH --  GENTPEAK --  GENTRANDOM --  TOBRATROUGH --  TOBRAPEAK --  TOBRARND --  AMIKACINPEAK --  AMIKACINTROU --  AMIKACIN --     Assessment: Danielle Spence admitted with cellulitis on back. Pharmacy consulted to manage vancomycin and cefepime. 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 Vancomycin infusion rate has been slowed due to this and physician had ordered pre-dose diphenhydramine x3 doses.   S/p I+D  Abscess culture with staph aureus - awaiting sensitivities.  Goal of Therapy:  Vancomycin trough level 10-15 mcg/ml  Plan:  1. Continue vancomycin 1gm IV Q8hr infused over 2 hours 2. Continue cefepime 2gm IV Q12hr 3. Follow up with cultures, troughs as needed, and discharge planning.  Okey Regal, PharmD [phone] 02/08/2012 12:56 PM

## 2012-02-08 NOTE — Progress Notes (Signed)
Patient ID: Danielle Spence, female   DOB: 23-Mar-1986, 26 y.o.   MRN: [id number]  [facility] PROGRESS NOTE  Danielle Spence AOZ:[id number] DOB: February 10, 1986 DOA: 02/04/2012 PCP: Sheila Oats, MD  Assessment/Plan:  1. Cellulitis upper back: Patient has been on broad-spectrum antibiotics for 4 days now. She is s/p I&D, post op day #2. She is slightly clinically improving but will need additional day in the hospital to ensure that wound is healing. WBC is trending down and pt remains afebrile overnight. Continue current antibiotics.  2. Hypokalemia: Replete. Check BMP in AM.   Code Status: Full code  Family Communication: None at bedside  Disposition Plan: Home when improved    Brief narrative:  Patient is a 26 year old Asian female who was admitted on 7/29 for a back infection with significant erythema, swelling and tenderness.  Consultants:  General surgery Procedures:  Antibiotics:  Cefepime day 4 Vancomycin day   HPI/Subjective: No events overnight.   Objective: Filed Vitals:   02/07/12 1300 02/07/12 2201 02/08/12 0418 02/08/12 1338  BP: 124/88 101/67 93/58 121/72  Pulse: 87 98 100 110  Temp: 98.7 F (37.1 C) 98.2 F (36.8 C) 99.3 F (37.4 C) 98.5 F (36.9 C)  TempSrc:  Oral Oral Oral  Resp: 20 18 20 20   Height:      Weight:      SpO2: 97% 96% 95% 99%    Intake/Output Summary (Last 24 hours) at 02/08/12 1349 Last data filed at 02/08/12 1337  Gross per 24 hour  Intake    600 ml  Output      0 ml  Net    600 ml    Exam:   General:  Pt is alert, follows commands appropriately, not in acute distress  Cardiovascular: Regular rate and rhythm, S1/S2, no murmurs, no rubs, no gallops  Respiratory: Clear to auscultation bilaterally, no wheezing, no crackles, no rhonchi  Abdomen: Soft, non tender, non distended, bowel sounds present, no guarding  Extremities: No edema, pulses DP and PT palpable bilaterally  Data Reviewed: Basic Metabolic Panel:  Lab 02/08/12  1145 02/07/12 0855 02/05/12 0600 02/04/12 2035  NA 140 140 139 136  K 3.7 3.2* 3.2* 2.8*  CL 105 104 109 99  CO2 26 25 24 21   GLUCOSE 83 88 126* 178*  BUN 3* 4* 5* 5*  CREATININE 0.58 0.61 0.66 0.75  CALCIUM 8.9 8.8 8.2* 9.1  MG -- -- -- --  PHOS -- -- -- --   Liver Function Tests:  Lab 02/05/12 0600 02/04/12 2035  AST 20 16  ALT 19 17  ALKPHOS 66 80  BILITOT 1.0 1.1  PROT 6.1 7.9  ALBUMIN 2.5* 3.4*   CBC:  Lab 02/08/12 0530 02/07/12 0855 02/06/12 0105 02/05/12 0600 02/04/12 2035  WBC 12.0* 14.7* 21.1* 17.4* 26.5*  NEUTROABS -- -- 17.0* 12.7* 22.8*  HGB 11.6* 12.7 12.1 11.7* 13.9  HCT 34.3* 37.1 34.9* 33.6* 40.6  MCV 85.3 85.5 85.3 85.5 85.1  PLT 347 299 293 227 309   CBG:  Lab 02/07/12 2203 02/07/12 1706  GLUCAP 138* 96    Recent Results (from the past 240 hour(s))  CULTURE, BLOOD (ROUTINE X 2)     Status: Normal (Preliminary result)   Collection Time   02/04/12  8:40 PM      Component Value Range Status Comment   Specimen Description BLOOD ARM LEFT   Final    Special Requests BOTTLES DRAWN AEROBIC AND ANAEROBIC 10CC   Final  Culture  Setup Time 02/05/2012 01:46   Final    Culture     Final    Value:        BLOOD CULTURE RECEIVED NO GROWTH TO DATE CULTURE WILL BE HELD FOR 5 DAYS BEFORE ISSUING A FINAL NEGATIVE REPORT   Report Status PENDING   Incomplete   CULTURE, BLOOD (ROUTINE X 2)     Status: Normal (Preliminary result)   Collection Time   02/04/12  8:45 PM      Component Value Range Status Comment   Specimen Description BLOOD ARM LEFT   Final    Special Requests BOTTLES DRAWN AEROBIC AND ANAEROBIC 10CC   Final    Culture  Setup Time 02/05/2012 01:46   Final    Culture     Final    Value:        BLOOD CULTURE RECEIVED NO GROWTH TO DATE CULTURE WILL BE HELD FOR 5 DAYS BEFORE ISSUING A FINAL NEGATIVE REPORT   Report Status PENDING   Incomplete   SURGICAL PCR SCREEN     Status: Normal   Collection Time   02/06/12 10:24 AM      Component Value Range Status  Comment   MRSA, PCR NEGATIVE  NEGATIVE Final    Staphylococcus aureus NEGATIVE  NEGATIVE Final   CULTURE, ROUTINE-ABSCESS     Status: Normal (Preliminary result)   Collection Time   02/06/12  3:42 PM      Component Value Range Status Comment   Specimen Description ABSCESS BACK   Final    Special Requests PATIENT ON FOLLOWING VANCOMYCIN   Final    Gram Stain     Final    Value: ABUNDANT WBC PRESENT,BOTH PMN AND MONONUCLEAR     NO SQUAMOUS EPITHELIAL CELLS SEEN     NO ORGANISMS SEEN   Culture     Final    Value: MODERATE STAPHYLOCOCCUS AUREUS     Note: RIFAMPIN AND GENTAMICIN SHOULD NOT BE USED AS SINGLE DRUGS FOR TREATMENT OF STAPH INFECTIONS.   Report Status PENDING   Incomplete   ANAEROBIC CULTURE     Status: Normal (Preliminary result)   Collection Time   02/06/12  3:42 PM      Component Value Range Status Comment   Specimen Description ABSCESS BACK   Final    Special Requests PATIENT ON FOLLOWING VANCOMYCIN   Final    Gram Stain     Final    Value: ABUNDANT WBC PRESENT,BOTH PMN AND MONONUCLEAR     NO SQUAMOUS EPITHELIAL CELLS SEEN     NO ORGANISMS SEEN   Culture     Final    Value: NO ANAEROBES ISOLATED; CULTURE IN PROGRESS FOR 5 DAYS   Report Status PENDING   Incomplete      Scheduled Meds:   . ceFEPime (MAXIPIME) IV  2 g Intravenous Q12H  . potassium chloride  40 mEq Oral BID  . promethazine  12.5 mg Intravenous Once  . sodium chloride  3 mL Intravenous Q12H  . vancomycin  1,000 mg Intravenous Q8H   Continuous Infusions:   . sodium chloride 100 mL/hr at 02/08/12 1056     MAGICK-[name], Sherlon Handing, MD  [facility] Pager [phone]  If 7PM-7AM, please contact night-coverage [email or web address] Password [email or web address] 02/08/2012, 1:49 PM   LOS: 4 days

## 2012-02-08 NOTE — Progress Notes (Signed)
Patient ID: Danielle Spence, female   DOB: March 05, 1986, 26 y.o.   MRN: [id number] 2 Days Post-Op  Subjective: Pt c/o pain in her back.  No other complaints  Objective: Vital signs in last 24 hours: Temp:  [98.2 F (36.8 C)-99.3 F (37.4 C)] 99.3 F (37.4 C) (08/02 0418) Pulse Rate:  [87-100] 100  (08/02 0418) Resp:  [18-20] 20  (08/02 0418) BP: (93-124)/(58-88) 93/58 mmHg (08/02 0418) SpO2:  [95 %-97 %] 95 % (08/02 0418) Last BM Date: 02/06/12  Intake/Output from previous day: 08/01 0701 - 08/02 0700 In: 360 [P.O.:360] Out: -  Intake/Output this shift:    PE: Back: mid back with large wound from abscess, beefy red tissue in base with only min fibrinous slough, small tunneled area inferior and to the right, very tender and patient had hard time tolerating the change.  Lab Results:   Basename 02/08/12 0530 02/07/12 0855  WBC 12.0* 14.7*  HGB 11.6* 12.7  HCT 34.3* 37.1  PLT 347 299   BMET  Basename 02/07/12 0855  NA 140  K 3.2*  CL 104  CO2 25  GLUCOSE 88  BUN 4*  CREATININE 0.61  CALCIUM 8.8   PT/INR No results found for this basename: LABPROT:2,INR:2 in the last 72 hours CMP     Component Value Date/Time   NA 140 02/07/2012 0855   K 3.2* 02/07/2012 0855   CL 104 02/07/2012 0855   CO2 25 02/07/2012 0855   GLUCOSE 88 02/07/2012 0855   BUN 4* 02/07/2012 0855   CREATININE 0.61 02/07/2012 0855   CALCIUM 8.8 02/07/2012 0855   PROT 6.1 02/05/2012 0600   ALBUMIN 2.5* 02/05/2012 0600   AST 20 02/05/2012 0600   ALT 19 02/05/2012 0600   ALKPHOS 66 02/05/2012 0600   BILITOT 1.0 02/05/2012 0600   GFRNONAA >90 02/07/2012 0855   GFRAA >90 02/07/2012 0855   Lipase  No results found for this basename: lipase       Studies/Results: No results found.  Anti-infectives: Anti-infectives     Start     Dose/Rate Route Frequency Ordered Stop   02/05/12 1800   ceFEPIme (MAXIPIME) 2 g in dextrose 5 % 50 mL IVPB        2 g 100 mL/hr over 30 Minutes Intravenous Every 12 hours 02/05/12 0329     02/05/12 0800   vancomycin (VANCOCIN) IVPB 1000 mg/200 mL premix  Status:  Discontinued        1,000 mg 200 mL/hr over 60 Minutes Intravenous Every 8 hours 02/05/12 0324 02/05/12 0331   02/05/12 0800   vancomycin (VANCOCIN) IVPB 1000 mg/200 mL premix        1,000 mg 100 mL/hr over 120 Minutes Intravenous Every 8 hours 02/05/12 0331     02/05/12 0400   ceFEPIme (MAXIPIME) 2 g in dextrose 5 % 50 mL IVPB        2 g 100 mL/hr over 30 Minutes Intravenous  Once 02/05/12 0329 02/05/12 0523   02/04/12 2230   vancomycin (VANCOCIN) 1,250 mg in sodium chloride 0.9 % 250 mL IVPB        1,250 mg 166.7 mL/hr over 90 Minutes Intravenous To Emergency Dept 02/04/12 2153 02/05/12 0153           Assessment/Plan  1.  POD#2-Back abscess I and D: wound looks healthy, WBC trending down-on vanc and maxipime, pain control issues especially with dressing changes.  Likely need another day in hospital with dressing changes before they will  be able to be tolerated as an outpatient.  Will follow.   LOS: 4 days    Danielle Spence 02/08/2012

## 2012-02-08 NOTE — Plan of Care (Signed)
Problem: Phase III Progression Outcomes Goal: Pain controlled on oral analgesia Outcome: Completed/Met Date Met:  02/08/12 But patient was made aware that she could still have IV when dressing was being changed or when she needed it for breakthrough pain

## 2012-02-09 ENCOUNTER — Encounter (HOSPITAL_COMMUNITY): Payer: Self-pay | Admitting: *Deleted

## 2012-02-09 LAB — BASIC METABOLIC PANEL
BUN: 5 mg/dL — ABNORMAL LOW (ref 6–23)
Calcium: 9.1 mg/dL (ref 8.4–10.5)
Creatinine, Ser: 0.56 mg/dL (ref 0.50–1.10)
GFR calc Af Amer: >90 mL/min (ref >90)
GFR calc non Af Amer: >90 mL/min (ref >90)

## 2012-02-09 LAB — CBC
HCT: 36.2 % (ref 36.0–46.0)
MCHC: 34 g/dL (ref 30.0–36.0)
Platelets: 367 10*3/uL (ref 150–400)
RDW: 12 % (ref 11.5–15.5)
WBC: 8.3 10*3/uL (ref 4.0–10.5)

## 2012-02-09 LAB — CULTURE, ROUTINE-ABSCESS

## 2012-02-09 MED ORDER — HYDROCODONE-ACETAMINOPHEN 5-325 MG PO TABS: 1.0000 | ORAL_TABLET | ORAL | Status: DC | PRN

## 2012-02-09 MED ORDER — CYCLOBENZAPRINE HCL 5 MG PO TABS: 5.0000 mg | ORAL_TABLET | Freq: Three times a day (TID) | ORAL | Status: AC | PRN

## 2012-02-09 MED ORDER — ONDANSETRON HCL 4 MG PO TABS: 4.0000 mg | ORAL_TABLET | Freq: Four times a day (QID) | ORAL | Status: AC | PRN

## 2012-02-09 MED ORDER — SULFAMETHOXAZOLE-TRIMETHOPRIM 800-160 MG PO TABS: 1.0000 | ORAL_TABLET | Freq: Two times a day (BID) | ORAL | Status: AC

## 2012-02-09 NOTE — Discharge Summary (Signed)
Physician Discharge Summary  Danielle Spence MVH:[id number] DOB: [birth year] DOA: 02/04/2012  PCP: Sheila Oats, MD  Admit date: 02/04/2012 Discharge date: 02/09/2012  Recommendations for Outpatient Follow-up:  1. Pt will need to follow up with surgery in 2-3 weeks post discharge to evaluate wound healing 2. Phone number and address was provided and pt reported that she will schedule an appointment at her convenience  3. Please obtain BMP to evaluate electrolytes and kidney function 4. Please also check CBC to evaluate Hg and Hct levels 5. Wound care and dressing change education provided and pt verbalized understanding 6. Please note that pt was sent home on antibiotics to complete the therapy   Discharge Diagnoses: Cellulitis of the back  Discharge Condition: Stable  Diet recommendation: Heart healthy diet discussed in details   History of present illness:  26 year-old female with history of bronchial asthma has been noticed increasing swelling of her right upper back over the last few days prior to admission which has worsened. In the ER patient was found to be febrile and tachycardic. Patient also has had significant leukocytosis. The ER physician tried to do I&D but was not of much help. Patient has been admitted for IV antibiotic therapy and further management. Patient states her last year she had a similar abscess in the thigh which was drained. Patient denied any chest pain or shortness of breath.  Hospital Course:   1. Cellulitis upper back: Patient has been on broad-spectrum antibiotics for 5 days. She is s/p I&D, post op day #3. She has clinically improved but will need to continue to use antibiotic in an outpt setting and will need to follow up with surgery to evaluate the wound healing.  2. Hypokalemia: Supplemented. Check BMP on follow up.  Procedures/Studies: Ct Chest W Contrast 02/05/2012    IMPRESSION:  There is evidence of cellulitis within the adipose tissues of the  back, most prominent centrally into the right of midline.   There is no subcutaneous emphysema or abscess formation, however.    Consultations:  Surgery  Antibiotics:  Cefepime day #5  Vancomycin day #5  Discharge Exam: Filed Vitals:   02/09/12 0532  BP: 92/60  Pulse: 74  Temp: 97.8 F (36.6 C)  Resp: 18   Filed Vitals:   02/08/12 1338 02/08/12 2110 02/09/12 0529 02/09/12 0532  BP: 121/72 119/79  92/60  Pulse: 110 91  74  Temp: 98.5 F (36.9 C) 98.4 F (36.9 C)  97.8 F (36.6 C)  TempSrc: Oral Oral Oral Oral  Resp: 20 20  18   Height:      Weight:      SpO2: 99% 99%  97%    General: Pt is alert, follows commands appropriately, not in acute distress Cardiovascular: Regular rate and rhythm, S1/S2 +, no murmurs, no rubs, no gallops Respiratory: Clear to auscultation bilaterally, no wheezing, no crackles, no rhonchi Abdominal: Soft, non tender, non distended, bowel sounds +, no guarding Extremities: no edema, no cyanosis, pulses palpable bilaterally DP and PT Neuro: Grossly nonfocal  Discharge Instructions  Discharge Orders    Future Orders Please Complete By Expires   Diet - low sodium heart healthy      Increase activity slowly      Change dressing (specify)      Comments:   Dressing change: daily     Medication List  As of 02/09/2012  9:46 AM   TAKE these medications         acetaminophen 500 MG tablet  Commonly known as: TYLENOL   Take 500 mg by mouth every 6 (six) hours as needed. For pain      cyclobenzaprine 5 MG tablet   Commonly known as: FLEXERIL   Take 1 tablet (5 mg total) by mouth 3 (three) times daily as needed for muscle spasms.      HYDROcodone-acetaminophen 5-325 MG per tablet   Commonly known as: NORCO/VICODIN   Take 1-2 tablets by mouth every 4 (four) hours as needed.      multivitamin with minerals Tabs   Take 1 tablet by mouth daily.      ondansetron 4 MG tablet   Commonly known as: ZOFRAN   Take 1 tablet (4 mg total) by mouth  every 6 (six) hours as needed for nausea.      sulfamethoxazole-trimethoprim 800-160 MG per tablet   Commonly known as: BACTRIM DS,SEPTRA DS   Take 1 tablet by mouth 2 (two) times daily.           Follow-up Information    Follow up with [facility], [place] on 02/13/2012. (3 pm , initial visit is $50, bring id and and income information)    Contact information:   [address] Maurie Boettcher [place] [address] [phone]       Follow up with HOXWORTH,BENJAMIN T, MD in 2 weeks.   Contact information:   [address] [place] [address] [phone]           The results of significant diagnostics from this hospitalization (including imaging, microbiology, ancillary and laboratory) are listed below for reference.     Microbiology: Recent Results (from the past 240 hour(s))  CULTURE, BLOOD (ROUTINE X 2)     Status: Normal (Preliminary result)   Collection Time   02/04/12  8:40 PM      Component Value Range Status Comment   Specimen Description BLOOD ARM LEFT   Final    Special Requests BOTTLES DRAWN AEROBIC AND ANAEROBIC 10CC   Final    Culture  Setup Time 02/05/2012 01:46   Final    Culture     Final    Value:        BLOOD CULTURE RECEIVED NO GROWTH TO DATE CULTURE WILL BE HELD FOR 5 DAYS BEFORE ISSUING A FINAL NEGATIVE REPORT   Report Status PENDING   Incomplete   CULTURE, BLOOD (ROUTINE X 2)     Status: Normal (Preliminary result)   Collection Time   02/04/12  8:45 PM      Component Value Range Status Comment   Specimen Description BLOOD ARM LEFT   Final    Special Requests BOTTLES DRAWN AEROBIC AND ANAEROBIC 10CC   Final    Culture  Setup Time 02/05/2012 01:46   Final    Culture     Final    Value:        BLOOD CULTURE RECEIVED NO GROWTH TO DATE CULTURE WILL BE HELD FOR 5 DAYS BEFORE ISSUING A FINAL NEGATIVE REPORT   Report Status PENDING   Incomplete   SURGICAL PCR SCREEN     Status: Normal   Collection Time   02/06/12 10:24 AM        Component Value Range Status Comment   MRSA, PCR NEGATIVE  NEGATIVE Final    Staphylococcus aureus NEGATIVE  NEGATIVE Final   CULTURE, ROUTINE-ABSCESS     Status: Normal (Preliminary result)   Collection Time   02/06/12  3:42 PM  Component Value Range Status Comment   Specimen Description ABSCESS BACK   Final    Special Requests PATIENT ON FOLLOWING VANCOMYCIN   Final    Gram Stain     Final    Value: ABUNDANT WBC PRESENT,BOTH PMN AND MONONUCLEAR     NO SQUAMOUS EPITHELIAL CELLS SEEN     NO ORGANISMS SEEN   Culture     Final    Value: MODERATE STAPHYLOCOCCUS AUREUS     Note: RIFAMPIN AND GENTAMICIN SHOULD NOT BE USED AS SINGLE DRUGS FOR TREATMENT OF STAPH INFECTIONS.   Report Status PENDING   Incomplete   ANAEROBIC CULTURE     Status: Normal (Preliminary result)   Collection Time   02/06/12  3:42 PM      Component Value Range Status Comment   Specimen Description ABSCESS BACK   Final    Special Requests PATIENT ON FOLLOWING VANCOMYCIN   Final    Gram Stain     Final    Value: ABUNDANT WBC PRESENT,BOTH PMN AND MONONUCLEAR     NO SQUAMOUS EPITHELIAL CELLS SEEN     NO ORGANISMS SEEN   Culture     Final    Value: NO ANAEROBES ISOLATED; CULTURE IN PROGRESS FOR 5 DAYS   Report Status PENDING   Incomplete      Labs: Basic Metabolic Panel:  Lab 02/09/12 1610 02/08/12 1145 02/07/12 0855 02/05/12 0600 02/04/12 2035  NA 140 140 140 139 136  K 3.6 3.7 3.2* 3.2* 2.8*  CL 104 105 104 109 99  CO2 26 26 25 24 21   GLUCOSE 83 83 88 126* 178*  BUN 5* 3* 4* 5* 5*  CREATININE 0.56 0.58 0.61 0.66 0.75  CALCIUM 9.1 8.9 8.8 8.2* 9.1  MG -- -- -- -- --  PHOS -- -- -- -- --   Liver Function Tests:  Lab 02/05/12 0600 02/04/12 2035  AST 20 16  ALT 19 17  ALKPHOS 66 80  BILITOT 1.0 1.1  PROT 6.1 7.9  ALBUMIN 2.5* 3.4*   CBC:  Lab 02/09/12 0647 02/08/12 0530 02/07/12 0855 02/06/12 0105 02/05/12 0600 02/04/12 2035  WBC 8.3 12.0* 14.7* 21.1* 17.4* --  NEUTROABS -- -- -- 17.0*  12.7* 22.8*  HGB 12.3 11.6* 12.7 12.1 11.7* --  HCT 36.2 34.3* 37.1 34.9* 33.6* --  MCV 85.6 85.3 85.5 85.3 85.5 --  PLT 367 347 299 293 227 --   CBG:  Lab 02/07/12 2203 02/07/12 1706  GLUCAP 138* 96     SIGNED: Time coordinating discharge: Over 30 minutes  Debbora Presto, MD  [facility] 02/09/2012, 9:46 AM Pager [phone]  If 7PM-7AM, please contact night-coverage [email or web address] Password [email or web address]

## 2012-02-11 LAB — CULTURE, BLOOD (ROUTINE X 2)

## 2012-02-11 LAB — ANAEROBIC CULTURE

## 2012-02-14 ENCOUNTER — Encounter (HOSPITAL_COMMUNITY): Payer: Self-pay | Admitting: Emergency Medicine

## 2012-02-14 ENCOUNTER — Emergency Department (HOSPITAL_COMMUNITY)
Admission: EM | Admit: 2012-02-14 | Discharge: 2012-02-14 | Disposition: A | Discharge status: Home / Self Care | Payer: Self-pay | Attending: Emergency Medicine | Admitting: Emergency Medicine

## 2012-02-14 DIAGNOSIS — L03319 Cellulitis of trunk, unspecified: Secondary | ICD-10-CM | POA: Insufficient documentation

## 2012-02-14 DIAGNOSIS — L0291 Cutaneous abscess, unspecified: Secondary | ICD-10-CM

## 2012-02-14 DIAGNOSIS — J45909 Unspecified asthma, uncomplicated: Secondary | ICD-10-CM | POA: Insufficient documentation

## 2012-02-14 DIAGNOSIS — L02219 Cutaneous abscess of trunk, unspecified: Secondary | ICD-10-CM | POA: Insufficient documentation

## 2012-02-14 HISTORY — DX: Cutaneous abscess, unspecified: L02.91

## 2012-02-14 NOTE — ED Provider Notes (Signed)
Medical screening examination/treatment/procedure(s) were performed by non-physician practitioner and as supervising physician I was immediately available for consultation/collaboration.  Marlynn Hinckley K Krithi Bray-Rasch, MD 02/14/12 0425 

## 2012-02-14 NOTE — ED Notes (Addendum)
Reports was here and had surgery on boil on back and had been discharged home; reports sister took packing out and replaced with gauze; reports unable to get gauze back out; pt reports that she does not have home health nurse to do packing and she and her sister are unsure about it; would like to be repacked and taught; pt denies fevers

## 2012-02-14 NOTE — ED Provider Notes (Signed)
History     CSN: [id number]  Arrival date & time 02/14/12  0149   First MD Initiated Contact with Patient 02/14/12 [phone]      Chief Complaint  Patient presents with  . Abscess    (Consider location/radiation/quality/duration/timing/severity/associated sxs/prior treatment) HPI Comments: Patient had a large abscess on her back.  I indeed, surgically, she was to have the packing removed by her sister today, but her sister was unable to perform this as she was afraid she would hurt her.  She has not had any fever, discharge, drainage, myalgias.  Patient is a 26 y.o. female presenting with abscess. The history is provided by the patient.  Abscess  This is a new problem. The current episode started yesterday. The problem has been unchanged. Pertinent negatives include no fever.    Past Medical History  Diagnosis Date  . PID (pelvic inflammatory disease)   . HPV (human papilloma virus) infection   . Asthma   . Abscess     Past Surgical History  Procedure Date  . Irrigation and debridement abscess 02/06/2012    Procedure: IRRIGATION AND DEBRIDEMENT ABSCESS;  Surgeon: Mariella Saa, MD;  Location: [facility] OR;  Service: General;  Laterality: Right;  I & D back abscess  . Cholecystectomy     No family history on file.  History  Substance Use Topics  . Smoking status: Never Smoker   . Smokeless tobacco: Not on file  . Alcohol Use: No    OB History    Grav Para Term Preterm Abortions TAB SAB Ect Mult Living                  Review of Systems  Constitutional: Negative for fever and chills.  Skin: Positive for wound.    Allergies  Review of patient's allergies indicates no known allergies.  Home Medications   Current Outpatient Rx  Name Route Sig Dispense Refill  . ACETAMINOPHEN 500 MG PO TABS Oral Take 500 mg by mouth every 6 (six) hours as needed. For pain    . CYCLOBENZAPRINE HCL 5 MG PO TABS Oral Take 1 tablet (5 mg total) by mouth 3 (three) times daily as needed  for muscle spasms. 45 tablet 0  . HYDROCODONE-ACETAMINOPHEN 5-325 MG PO TABS Oral Take 1-2 tablets by mouth every 4 (four) hours as needed. 65 tablet 0  . ADULT MULTIVITAMIN W/MINERALS CH Oral Take 1 tablet by mouth daily.    Marland Kitchen ONDANSETRON HCL 4 MG PO TABS Oral Take 1 tablet (4 mg total) by mouth every 6 (six) hours as needed for nausea. 45 tablet 1  . SULFAMETHOXAZOLE-TRIMETHOPRIM 800-160 MG PO TABS Oral Take 1 tablet by mouth 2 (two) times daily. 28 tablet 0    BP 112/62  Pulse 93  Temp 97.9 F (36.6 C) (Oral)  Resp 14  SpO2 97%  LMP 02/05/2012  Physical Exam  Constitutional: She appears well-developed and well-nourished.  HENT:  Head: Normocephalic.  Eyes: Pupils are equal, round, and reactive to light.  Neck: Normal range of motion.  Cardiovascular: Normal rate.   Musculoskeletal: Normal range of motion.  Neurological: She is alert.  Skin: Skin is warm and dry. No rash noted.    ED Course  Wound packing Date/Time: 02/14/2012 4:12 AM Performed by: Arman Filter Authorized by: Arman Filter Consent: Verbal consent obtained. Written consent not obtained. Risks and benefits: risks, benefits and alternatives were discussed Consent given by: patient Patient understanding: patient states understanding of the procedure being  performed Patient identity confirmed: verbally with patient Time out: Immediately prior to procedure a "time out" was called to verify the correct patient, procedure, equipment, support staff and site/side marked as required. Local anesthesia used: no Patient sedated: no Comments: Packing emoved.  An abscess repacked   (including critical care time)  Labs Reviewed - No data to display No results found.   1. Abscess       MDM   Patient abscess, looks good.  It is healing well minimal drainage.  Packing removed and repacked her significant other boyfriend was talked.  This procedure        Arman Filter, NP 02/14/12 0414  Arman Filter,  NP 02/14/12 [phone]

## 2012-02-14 NOTE — ED Notes (Signed)
Pt. Left prior to being discharged.

## 2012-10-16 ENCOUNTER — Emergency Department (HOSPITAL_COMMUNITY)
Admission: EM | Admit: 2012-10-16 | Discharge: 2012-10-16 | Disposition: A | Discharge status: Home / Self Care | Payer: Medicaid Other | Attending: Emergency Medicine | Admitting: Emergency Medicine

## 2012-10-16 ENCOUNTER — Encounter (HOSPITAL_COMMUNITY): Payer: Self-pay | Admitting: Emergency Medicine

## 2012-10-16 DIAGNOSIS — R109 Unspecified abdominal pain: Secondary | ICD-10-CM | POA: Insufficient documentation

## 2012-10-16 DIAGNOSIS — Z8619 Personal history of other infectious and parasitic diseases: Secondary | ICD-10-CM | POA: Insufficient documentation

## 2012-10-16 DIAGNOSIS — Z8742 Personal history of other diseases of the female genital tract: Secondary | ICD-10-CM | POA: Insufficient documentation

## 2012-10-16 DIAGNOSIS — J45909 Unspecified asthma, uncomplicated: Secondary | ICD-10-CM | POA: Insufficient documentation

## 2012-10-16 DIAGNOSIS — K429 Umbilical hernia without obstruction or gangrene: Secondary | ICD-10-CM | POA: Insufficient documentation

## 2012-10-16 DIAGNOSIS — O9989 Other specified diseases and conditions complicating pregnancy, childbirth and the puerperium: Secondary | ICD-10-CM | POA: Insufficient documentation

## 2012-10-16 DIAGNOSIS — Z872 Personal history of diseases of the skin and subcutaneous tissue: Secondary | ICD-10-CM | POA: Insufficient documentation

## 2012-10-16 NOTE — ED Notes (Signed)
PT. REPORTS PROGRESSING PAIN / PRESSURE AT UMBILICAL HERNIA FOR SEVERAL WEEKS , STATES [redacted] WEEKS PREGNANT.

## 2012-10-16 NOTE — ED Notes (Signed)
Pt discharged.Vital signs stable and GCS 15 

## 2012-10-16 NOTE — ED Provider Notes (Signed)
History     CSN: [id number]  Arrival date & time 10/16/12  0023   First MD Initiated Contact with Patient 10/16/12 [phone]      Chief Complaint  Patient presents with  . Hernia    (Consider location/radiation/quality/duration/timing/severity/associated sxs/prior treatment) The history is provided by the patient.  Danielle Spence is a 27 y.o. female hx of PID, HPV, [redacted] weeks pregnant here with pain at hernia site. Has history of umbilical hernia. Since she became pregnant she noticed that there is more more pain at the umbilical hernia site and now it is less reducible. Denies any nausea or vomiting and is still passing gas. Has not been taking anything for pain. Has prenatal care. No fevers or chills or urinary symptoms.   Past Medical History  Diagnosis Date  . PID (pelvic inflammatory disease)   . HPV (human papilloma virus) infection   . Asthma   . Abscess     Past Surgical History  Procedure Laterality Date  . Irrigation and debridement abscess  02/06/2012    Procedure: IRRIGATION AND DEBRIDEMENT ABSCESS;  Surgeon: Mariella Saa, MD;  Location: [facility] OR;  Service: General;  Laterality: Right;  I & D back abscess  . Cholecystectomy      No family history on file.  History  Substance Use Topics  . Smoking status: Never Smoker   . Smokeless tobacco: Not on file  . Alcohol Use: No    OB History   Grav Para Term Preterm Abortions TAB SAB Ect Mult Living   1               Review of Systems  Gastrointestinal: Positive for abdominal pain.  All other systems reviewed and are negative.    Allergies  Review of patient's allergies indicates no known allergies.  Home Medications   Current Outpatient Rx  Name  Route  Sig  Dispense  Refill  . HYDROcodone-acetaminophen (NORCO/VICODIN) 5-325 MG per tablet   Oral   Take 1 tablet by mouth every 4 (four) hours as needed. For pain         . Multiple Vitamin (MULITIVITAMIN WITH MINERALS) TABS   Oral   Take 1 tablet by  mouth daily.           BP 125/76  Pulse 107  Temp(Src) 98 F (36.7 C) (Oral)  Resp 14  SpO2 98%  LMP 02/05/2012  Physical Exam  Nursing note and vitals reviewed. Constitutional: She is oriented to person, place, and time. She appears well-developed and well-nourished.  HENT:  Head: Normocephalic.  Mouth/Throat: Oropharynx is clear and moist.  Eyes: Conjunctivae are normal. Pupils are equal, round, and reactive to light.  Neck: Normal range of motion. Neck supple.  Cardiovascular: Normal rate, regular rhythm and normal heart sounds.   Pulmonary/Chest: Effort normal and breath sounds normal. No respiratory distress. She has no wheezes. She has no rales.  Abdominal: Soft.  Gravid uterus. + umbilical hernia that is mildly tender but reducible. No redness around the site.   Musculoskeletal: Normal range of motion.  Neurological: She is alert and oriented to person, place, and time.  Skin: Skin is warm and dry.  Psychiatric: She has a normal mood and affect. Her behavior is normal. Judgment and thought content normal.    ED Course  Procedures (including critical care time)  Labs Reviewed - No data to display No results found.   No diagnosis found.    MDM  Danielle Spence is a  27 y.o. female here with pain at hernia site. No evidence of incarcerated hernia or signs of obstruction from hernia. I counseled her to take tylenol for pain. She should see a Careers adviser but will likely not be operated on until after she delivers. Return precaution given.          Richardean Canal, MD 10/16/12 [phone]

## 2014-05-07 IMAGING — CT CT CHEST W/ CM
2 of 4 series · 15 of 36 positions shown, 18 images · IV contrast (APPLIED)
Comparison: None.

CLINICAL DATA: Cellulitis on the back

CT CHEST WITH CONTRAST
TECHNIQUE: Multidetector CT imaging of the chest was performed
following the standard protocol during bolus administration of
intravenous contrast.
Contrast: 80mL OMNIPAQUE IOHEXOL 300 MG/ML  SOLN

[Series 2: routine chest 5.0 st · axial · 0.57mm/px · z∈[-352,-82]mm · 12 of 64 slices shown, 15 images]
[im 5/64  mediastinal]
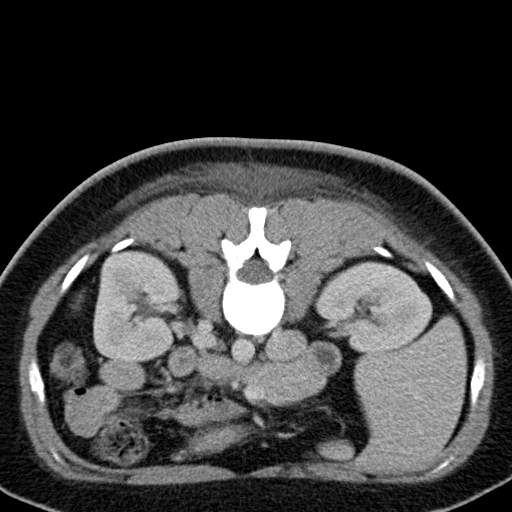
[im 5/64  lung]
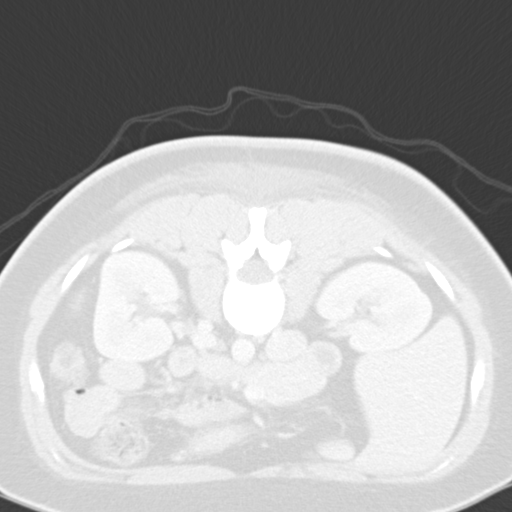
[im 9/64  lung]
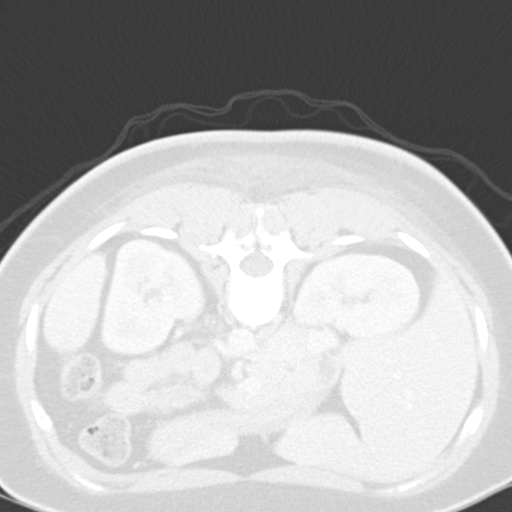
[im 13/64  lung]
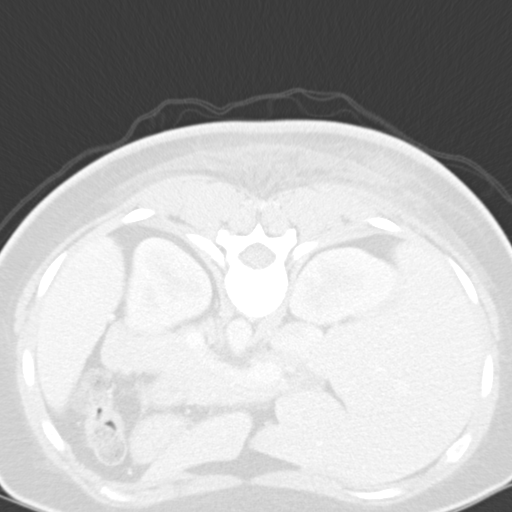
[im 22/64  lung]
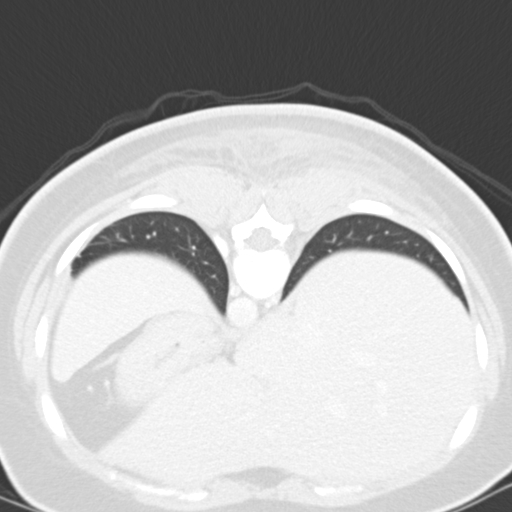
[im 26/64  mediastinal]
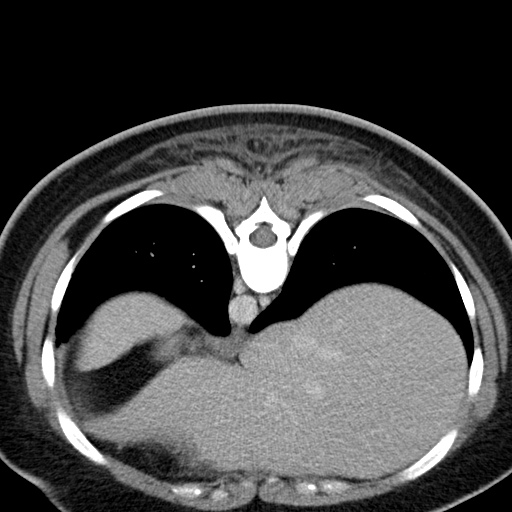
[im 26/64  lung]
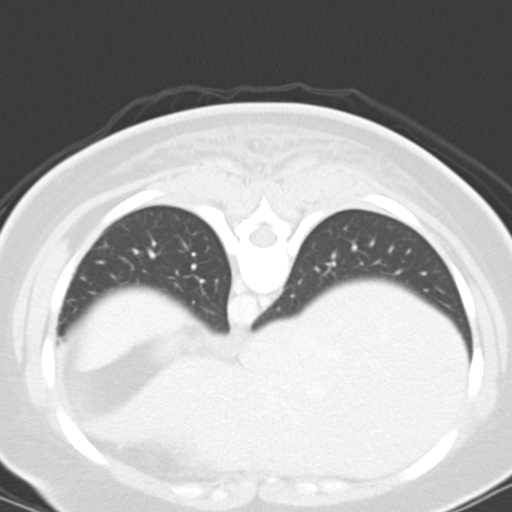
[im 30/64  lung]
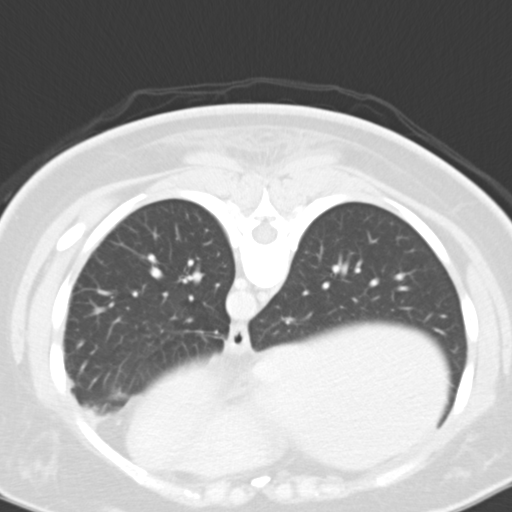
[im 34/64  lung]
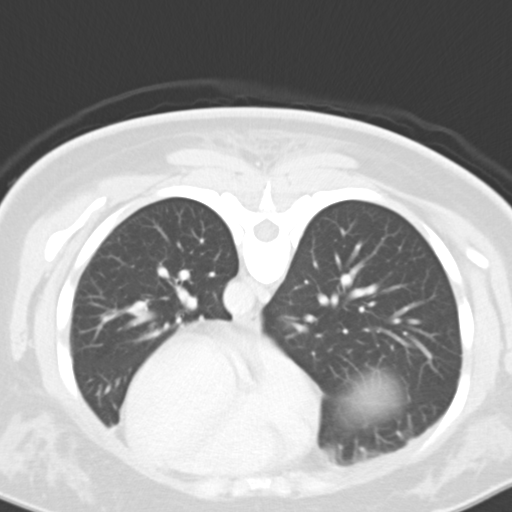
[im 38/64  lung]
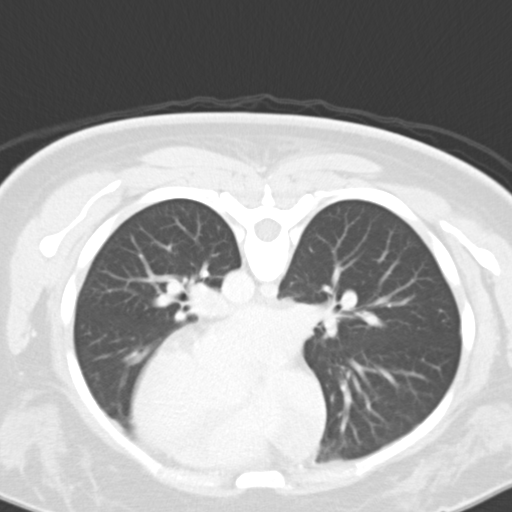
[im 43/64  mediastinal]
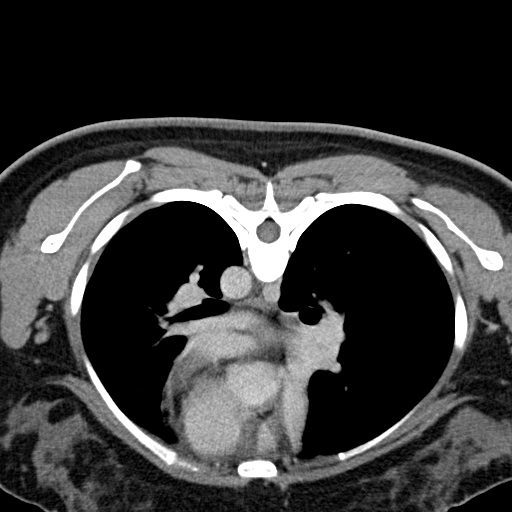
[im 43/64  lung]
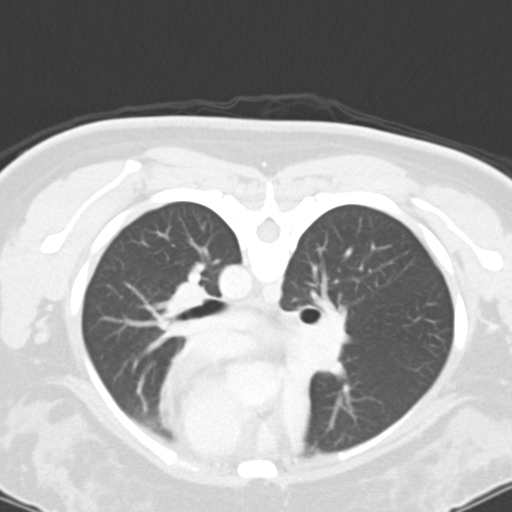
[im 51/64  lung]
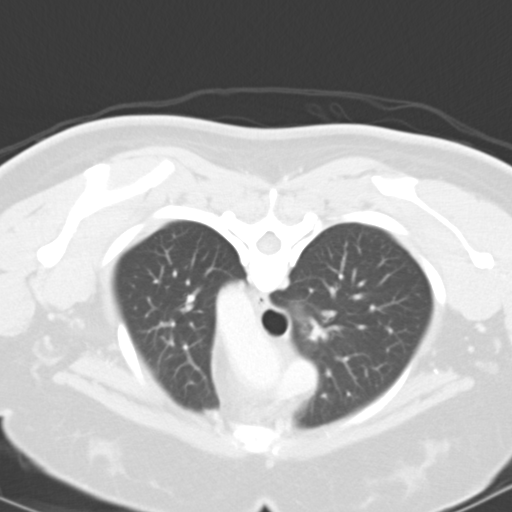
[im 55/64  lung]
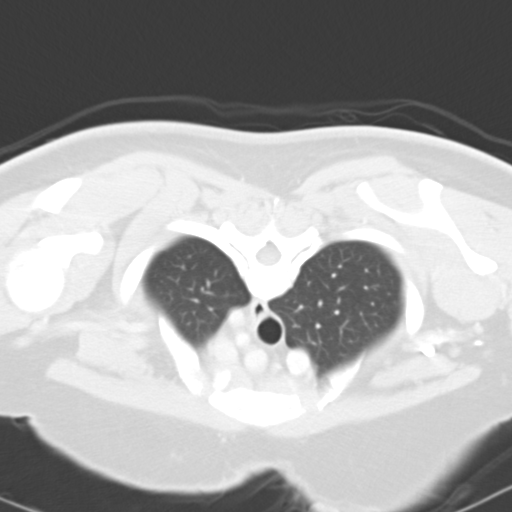
[im 59/64  lung]
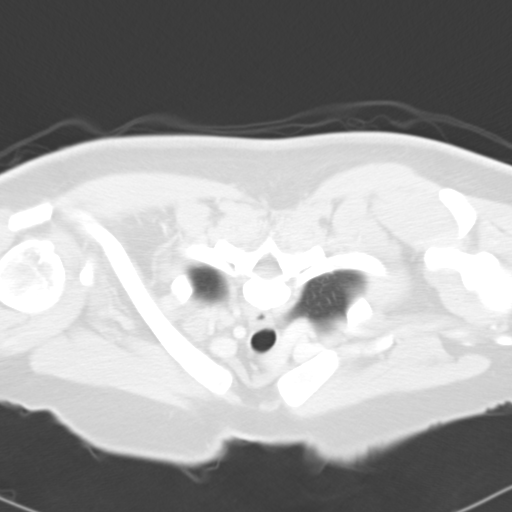

[Series 602: coronals- · coronal · 0.62mm/px · 3 of 117 slices shown]
[im 24/117  lung]
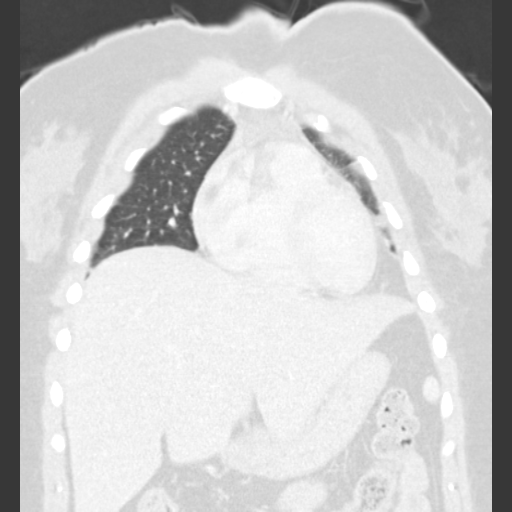
[im 47/117  lung]
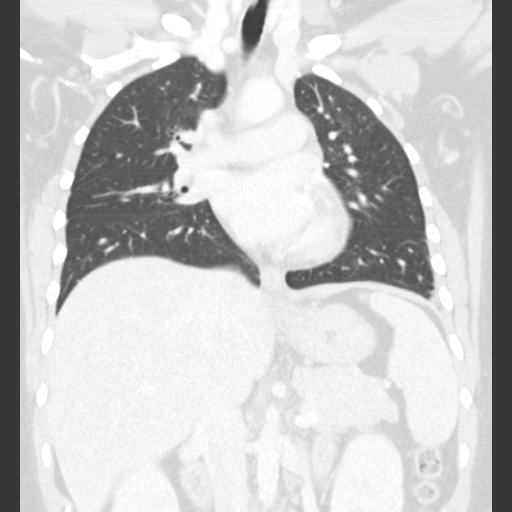
[im 70/117  lung]
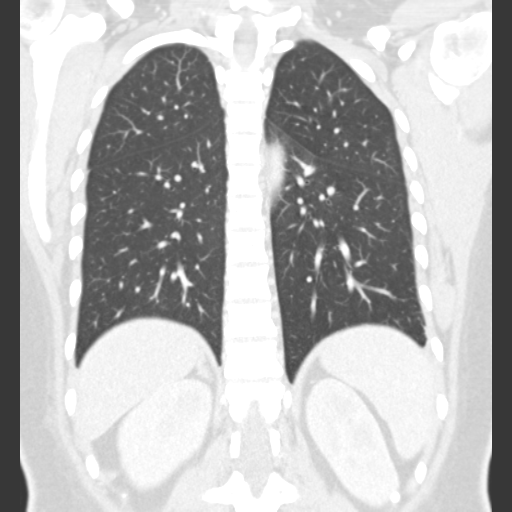

[15 of 36 positions shown; findings below may reference images not displayed]

FINDINGS: There is stranding within the deep and subcutaneous
central fat of the back, from approximately the T71 level through
the upper lumbar levels.  There is no abnormal soft tissue gas.  No
focal fluid collections are identified to suggest abscess
formation. The inflammatory changes extend to the paraspinous
musculature but there is no evidence of muscular edema.  The
central spinal canal has a normal appearance.

There is no axillary, mediastinal, or hilar adenopathy.  A 1.8 cm
cystic structure is located in the right infrahilar region (middle
mediastinum), likely a bronchogenic cyst.Both lungs are clear.  The
osseous structures are unremarkable. The visualized upper abdomen
is unremarkable.
IMPRESSION: There is evidence of cellulitis within the adipose tissues of the
back, most prominent centrally into the right of midline.  There is
no subcutaneous emphysema or abscess formation, however.

## 2014-05-10 ENCOUNTER — Encounter (HOSPITAL_COMMUNITY): Payer: Self-pay | Admitting: Emergency Medicine

## 2014-10-06 ENCOUNTER — Ambulatory Visit (HOSPITAL_COMMUNITY)
Admit: 2014-10-06 | Discharge: 2014-10-06 | Disposition: A | Discharge status: Home / Self Care | Payer: Medicaid Other | Source: Ambulatory Visit | Attending: Family Medicine | Admitting: Family Medicine

## 2014-10-06 ENCOUNTER — Encounter (HOSPITAL_COMMUNITY): Payer: Self-pay

## 2014-10-06 ENCOUNTER — Ambulatory Visit (HOSPITAL_COMMUNITY)
Admission: RE | Admit: 2014-10-06 | Discharge: 2014-10-06 | Disposition: A | Discharge status: Home / Self Care | Payer: Medicaid Other | Source: Ambulatory Visit | Attending: Family Medicine | Admitting: Family Medicine

## 2014-10-06 ENCOUNTER — Ambulatory Visit (INDEPENDENT_AMBULATORY_CARE_PROVIDER_SITE_OTHER): Payer: 59 | Admitting: Family Medicine

## 2014-10-06 VITALS — BP 139/84 | HR 112 | Temp 98.0°F | Resp 16 | Ht 61.0 in | Wt 170.0 lb

## 2014-10-06 DIAGNOSIS — R0789 Other chest pain: Secondary | ICD-10-CM

## 2014-10-06 DIAGNOSIS — A499 Bacterial infection, unspecified: Secondary | ICD-10-CM

## 2014-10-06 DIAGNOSIS — R531 Weakness: Secondary | ICD-10-CM | POA: Diagnosis not present

## 2014-10-06 DIAGNOSIS — R42 Dizziness and giddiness: Secondary | ICD-10-CM

## 2014-10-06 DIAGNOSIS — R51 Headache: Secondary | ICD-10-CM

## 2014-10-06 DIAGNOSIS — H538 Other visual disturbances: Secondary | ICD-10-CM | POA: Diagnosis not present

## 2014-10-06 DIAGNOSIS — N390 Urinary tract infection, site not specified: Secondary | ICD-10-CM | POA: Diagnosis not present

## 2014-10-06 DIAGNOSIS — D72829 Elevated white blood cell count, unspecified: Secondary | ICD-10-CM

## 2014-10-06 DIAGNOSIS — Z113 Encounter for screening for infections with a predominantly sexual mode of transmission: Secondary | ICD-10-CM

## 2014-10-06 DIAGNOSIS — R519 Headache, unspecified: Secondary | ICD-10-CM

## 2014-10-06 LAB — POCT CBC
Granulocyte percent: 64.6 %G (ref 37–80)
HCT, POC: 46 % (ref 37.7–47.9)
Hemoglobin: 14.3 g/dL (ref 12.2–16.2)
Lymph, poc: 3.9 — AB (ref 0.6–3.4)
MCH, POC: 27.6 pg (ref 27–31.2)
MCHC: 31.2 g/dL — AB (ref 31.8–35.4)
MCV: 88.5 fL (ref 80–97)
MID (cbc): 0.8 (ref 0–0.9)
MPV: 7.4 fL (ref 0–99.8)
POC Granulocyte: 8.6 — AB (ref 2–6.9)
POC LYMPH PERCENT: 29.6 %L (ref 10–50)
POC MID %: 5.8 % (ref 0–12)
Platelet Count, POC: 332 10*3/uL (ref 142–424)
RBC: 5.19 M/uL (ref 4.04–5.48)
RDW, POC: 14.1 %
WBC: 13.3 10*3/uL — AB (ref 4.6–10.2)

## 2014-10-06 LAB — POCT URINALYSIS DIPSTICK
Bilirubin, UA: NEGATIVE
Glucose, UA: NEGATIVE
Ketones, UA: NEGATIVE
Nitrite, UA: NEGATIVE
Protein, UA: NEGATIVE
Spec Grav, UA: 1.015
Urobilinogen, UA: 0.2
pH, UA: 6.5

## 2014-10-06 LAB — GLUCOSE, POCT (MANUAL RESULT ENTRY): POC Glucose: 93 mg/dl (ref 70–99)

## 2014-10-06 LAB — POCT UA - MICROSCOPIC ONLY
Bacteria, U Microscopic: NEGATIVE
Casts, Ur, LPF, POC: NEGATIVE
Crystals, Ur, HPF, POC: NEGATIVE
Mucus, UA: NEGATIVE
WBC, Ur, HPF, POC: NEGATIVE
Yeast, UA: NEGATIVE

## 2014-10-06 LAB — POCT URINE PREGNANCY: Preg Test, Ur: NEGATIVE

## 2014-10-06 MED ORDER — CEPHALEXIN 500 MG PO CAPS: 500.0000 mg | ORAL_CAPSULE | Freq: Three times a day (TID) | ORAL | Status: DC

## 2014-10-06 NOTE — Progress Notes (Signed)
Chief Complaint:  Chief Complaint  Patient presents with  . Dizziness    feels off balance light headed x50mo  . Headache    x 1 month    HPI: Danielle Spence is a 29 y.o. female who is here for 1 month hisotry of feeling dizzy, woozy, loosing her balance and having constant  8/10 HAs  in the frontal area for about 2-3 weeks She has taken Aleve which helps for a little bit but then comes back She feels nauseated but has not thrown up She has had blurred vision, no double vision, no eye pain, no photophobia; denies any noise sensitivities with her headaches She is weak overall,She has had midepigastric pain and SOB that is off and on She denies any swelling in her legs.  She has a mirena IUD , last year.  LMP is irregular, she is spotting currently When she is driving and stopping in her life she feels woozy and wants to pass out. She denies any risk factors for DVT/PE however she is on the MIrena Low/no risk factors of PE/DVT. Denies recent trauma/surgeires, hx of DVT/PE, long car or plane rides, sedentary lifestyle, OCP use, or malignancy She has some vaginal dc, brown dc 1 week ago but she started on her period as well Sister with a history of migraine headaches   Past Medical History  Diagnosis Date  . PID (pelvic inflammatory disease)   . HPV (human papilloma virus) infection   . Asthma   . Abscess    Past Surgical History  Procedure Laterality Date  . Irrigation and debridement abscess  02/06/2012    Procedure: IRRIGATION AND DEBRIDEMENT ABSCESS;  Surgeon: Mariella Saa, MD;  Location: [facility] OR;  Service: General;  Laterality: Right;  I & D back abscess  . Cholecystectomy     History   Social History  . Marital Status: Single    Spouse Name: N/A  . Number of Children: N/A  . Years of Education: N/A   Social History Main Topics  . Smoking status: Never Smoker   . Smokeless tobacco: Not on file  . Alcohol Use: No  . Drug Use: No  . Sexual Activity: Not on  file   Other Topics Concern  . None   Social History Narrative   No family history on file. No Known Allergies Prior to Admission medications   Medication Sig Start Date End Date Taking? Authorizing Provider  Multiple Vitamin (MULITIVITAMIN WITH MINERALS) TABS Take 1 tablet by mouth daily.    Historical Provider, MD     ROS: The patient denies fevers, chills, night sweats, unintentional weight loss, chest pain, palpitations, wheezing, dyspnea on exertion,  vomiting, abdominal pain, dysuria, hematuria, melena, numbness,  or tingling.   All other systems have been reviewed and were otherwise negative with the exception of those mentioned in the HPI and as above.    PHYSICAL EXAM: Filed Vitals:   10/06/14 2015  BP: 139/84  Pulse: 112  Temp:   Resp:    Filed Vitals:   10/06/14 1929  Height:  (1.549 m)  Weight: 170 lb (77.111 kg)   Body mass index is 32.14 kg/(m^2).  General: Alert, no acute distress, obese [place] female HEENT:  Normocephalic, atraumatic, oropharynx patent. EOMI, PERRLA, scopic exam normal Cardiovascular:  Regular rate and rhythm, no rubs murmurs or gallops.  No Carotid bruits, radial pulse intact. No pedal edema.  Respiratory: Clear to auscultation bilaterally.  No wheezes, rales,  or rhonchi.  No cyanosis, no use of accessory musculature GI: No organomegaly, abdomen is soft and non-tender, positive bowel sounds.  No masses. Skin: No rashes. Neurologic: Facial musculature symmetric. Cranial nerves II-12 grossly normal, unequivocal Romberg, dizzy with range of motion of her head. Dix-Hallpike did not show any nystagmus, she did have dizziness. Psychiatric: Patient is appropriate throughout our interaction.  She is not slow in conversation. I am not sure if she is understanding all of my questions fully but she is proficient in [place]. Wrists no language barrier however I feel that she is delayed cognitively. I am not sure if this is congenital or if this  is new onset. Lymphatic: No cervical lymphadenopathy Musculoskeletal: Gait intact. Data 5 strength, 2 out of 2 DTRs   LABS: Results for orders placed or performed in visit on 10/06/14  POCT CBC  Result Value Ref Range   WBC 13.3 (A) 4.6 - 10.2 K/uL   Lymph, poc 3.9 (A) 0.6 - 3.4   POC LYMPH PERCENT 29.6 10 - 50 %L   MID (cbc) 0.8 0 - 0.9   POC MID % 5.8 0 - 12 %M   POC Granulocyte 8.6 (A) 2 - 6.9   Granulocyte percent 64.6 37 - 80 %G   RBC 5.19 4.04 - 5.48 M/uL   Hemoglobin 14.3 12.2 - 16.2 g/dL   HCT, POC 16.146.0 09.637.7 - 47.9 %   MCV 88.5 80 - 97 fL   MCH, POC 27.6 27 - 31.2 pg   MCHC 31.2 (A) 31.8 - 35.4 g/dL   RDW, POC 04.514.1 %   Platelet Count, POC 332 142 - 424 K/uL   MPV 7.4 0 - 99.8 fL  POCT glucose (manual entry)  Result Value Ref Range   POC Glucose 93 70 - 99 mg/dl  POCT UA - Microscopic Only  Result Value Ref Range   WBC, Ur, HPF, POC neg    RBC, urine, microscopic 0-1    Bacteria, U Microscopic neg    Mucus, UA neg    Epithelial cells, urine per micros 0-2    Crystals, Ur, HPF, POC neg    Casts, Ur, LPF, POC neg    Yeast, UA neg   POCT urinalysis dipstick  Result Value Ref Range   Color, UA yellow    Clarity, UA clear    Glucose, UA neg    Bilirubin, UA neg    Ketones, UA neg    Spec Grav, UA 1.015    Blood, UA trace-intact    pH, UA 6.5    Protein, UA neg    Urobilinogen, UA 0.2    Nitrite, UA neg    Leukocytes, UA Trace   POCT urine pregnancy  Result Value Ref Range   Preg Test, Ur Negative      EKG/XRAY:   [facility]   ASSESSMENT/PLAN: Encounter Diagnoses  Name Primary?  . Dizziness and giddiness Yes  . Acute intractable headache, unspecified headache type   . Weakness   . Chest tightness   . Screening for STD (sexually transmitted disease) Leukocytosis     29 year old [place] female with a one-month history of dizziness, frontal headache that is constant without a history of migraine headaches and  weakness. EKG is within normal sinus rhythm, declined chest x-ray doesn't feel like it's in her chest. She again does not have any risk factors for PE/DVT except being on the Mirena. However the symptoms have been ongoing for 1  month. Orthostatics borderline Neuro exam is grossly normal however she did have dizziness with Romberg and orthostatic testing This might be just vertigo related to BPPV but with the prolonged history of dizziness, weakness and headache that is intractable and like to go ahead and get a CT scan of the head to rule out any malignancy Labs pending: CMP, TSH, urine culture Will treat leukocytosis as if it is a urinary tract infection at this time with Keflex by mouth 3 times a day 10 days and will wait for stat CT scan results She is to follow-up once we get her test results., Precautions given to follow-up sooner.    Gross sideeffects, risk and benefits, and alternatives of medications d/w patient. Patient is aware that all medications have potential sideeffects and we are unable to predict every sideeffect or drug-drug interaction that may occur.  Hamilton Capri PHUONG, DO 10/06/2014 8:27 PM

## 2014-10-06 NOTE — Patient Instructions (Addendum)
[facility] / go to the Emergency room/ to register as an outpatient (tell them you are there for scan only not to go through emergency department) They will then send you to radiology for the scan.        Urinary tract infections (UTIs) can develop anywhere along your urinary tract. Your urinary tract is your body's drainage system for removing wastes and extra water. Your urinary tract includes two kidneys, two ureters, a bladder, and a urethra. Your kidneys are a pair of bean-shaped organs. Each kidney is about the size of your fist. They are located below your ribs, one on each side of your spine. CAUSES Infections are caused by microbes, which are microscopic organisms, including fungi, viruses, and bacteria. These organisms are Spaid small that they can only be seen through a microscope. Bacteria are the microbes that most commonly cause UTIs. SYMPTOMS  Symptoms of UTIs may vary by age and gender of the patient and by the location of the infection. Symptoms in young women typically include a frequent and intense urge to urinate and a painful, burning feeling in the bladder or urethra during urination. Older women and men are more likely to be tired, shaky, and weak and have muscle aches and abdominal pain. A fever may mean the infection is in your kidneys. Other symptoms of a kidney infection include pain in your back or sides below the ribs, nausea, and vomiting. DIAGNOSIS To diagnose a UTI, your caregiver will ask you about your symptoms. Your caregiver also will ask to provide a urine sample. The urine sample will be tested for bacteria and white blood cells. White blood cells are made by your body to help fight infection. TREATMENT  Typically, UTIs can be treated with medication. Because most UTIs are caused by a bacterial infection, they usually can be treated with the use of antibiotics. The choice of antibiotic and length of treatment depend on your symptoms and  the type of bacteria causing your infection. HOME CARE INSTRUCTIONS  If you were prescribed antibiotics, take them exactly as your caregiver instructs you. Finish the medication even if you feel better after you have only taken some of the medication.  Drink enough water and fluids to keep your urine clear or pale yellow.  Avoid caffeine, tea, and carbonated beverages. They tend to irritate your bladder.  Empty your bladder often. Avoid holding urine for long periods of time.  Empty your bladder before and after sexual intercourse.  After a bowel movement, women should cleanse from front to back. Use each tissue only once. SEEK MEDICAL CARE IF:   You have back pain.  You develop a fever.  Your symptoms do not begin to resolve within 3 days. SEEK IMMEDIATE MEDICAL CARE IF:   You have severe back pain or lower abdominal pain.  You develop chills.  You have nausea or vomiting.  You have continued burning or discomfort with urination. MAKE SURE YOU:   Understand these instructions.  Will watch your condition.  Will get help right away if you are not doing well or get worse. Document Released: 04/04/2005 Document Revised: 12/25/2011 Document Reviewed: 08/03/2011 [facility] Patient Information 2015 [place], [place]. This information is not intended to replace advice given to you by your health care provider. Make sure you discuss any questions you have with your health care provider. Dizziness Dizziness is a common problem. It is a feeling of unsteadiness or light-headedness. You may feel like  you are about to faint. Dizziness can lead to injury if you stumble or fall. A person of any age group can suffer from dizziness, but dizziness is more common in older adults. CAUSES  Dizziness can be caused by many different things, including:  Middle ear problems.  Standing for too long.  Infections.  An allergic reaction.  Aging.  An emotional response to something, such as the  sight of blood.  Side effects of medicines.  Tiredness.  Problems with circulation or blood pressure.  Excessive use of alcohol or medicines, or illegal drug use.  Breathing too fast (hyperventilation).  An irregular heart rhythm (arrhythmia).  A low red blood cell count (anemia).  Pregnancy.  Vomiting, diarrhea, fever, or other illnesses that cause body fluid loss (dehydration).  Diseases or conditions such as Parkinson's disease, high blood pressure (hypertension), diabetes, and thyroid problems.  Exposure to extreme heat. DIAGNOSIS  Your health care provider will ask about your symptoms, perform a physical exam, and perform an electrocardiogram (ECG) to record the electrical activity of your heart. Your health care provider may also perform other heart or blood tests to determine the cause of your dizziness. These may include:  Transthoracic echocardiogram (TTE). During echocardiography, sound waves are used to evaluate how blood flows through your heart.  Transesophageal echocardiogram (TEE).  Cardiac monitoring. This allows your health care provider to monitor your heart rate and rhythm in real time.  Holter monitor. This is a portable device that records your heartbeat and can help diagnose heart arrhythmias. It allows your health care provider to track your heart activity for several days if needed.  Stress tests by exercise or by giving medicine that makes the heart beat faster. TREATMENT  Treatment of dizziness depends on the cause of your symptoms and can vary greatly. HOME CARE INSTRUCTIONS   Drink enough fluids to keep your urine clear or pale yellow. This is especially important in very hot weather. In older adults, it is also important in cold weather.  Take your medicine exactly as directed if your dizziness is caused by medicines. When taking blood pressure medicines, it is especially important to get up slowly.  Rise slowly from chairs and steady yourself  until you feel okay.  In the morning, first sit up on the side of the bed. When you feel okay, stand slowly while holding onto something until you know your balance is fine.  Move your legs often if you need to stand in one place for a long time. Tighten and relax your muscles in your legs while standing.  Have someone stay with you for 1-2 days if dizziness continues to be a problem. Do this until you feel you are well enough to stay alone. Have the person call your health care provider if he or she notices changes in you that are concerning.  Do not drive or use heavy machinery if you feel dizzy.  Do not drink alcohol. SEEK IMMEDIATE MEDICAL CARE IF:   Your dizziness or light-headedness gets worse.  You feel nauseous or vomit.  You have problems talking, walking, or using your arms, hands, or legs.  You feel weak.  You are not thinking clearly or you have trouble forming sentences. It may take a friend or family member to notice this.  You have chest pain, abdominal pain, shortness of breath, or sweating.  Your vision changes.  You notice any bleeding.  You have side effects from medicine that seems to be getting worse rather than  better. MAKE SURE YOU:   Understand these instructions.  Will watch your condition.  Will get help right away if you are not doing well or get worse. Document Released: 12/19/2000 Document Revised: 06/30/2013 Document Reviewed: 01/12/2011 [facility] Patient Information 2015 [place], [place]. This information is not intended to replace advice given to you by your health care provider. Make sure you discuss any questions you have with your health care provider.

## 2014-10-07 LAB — COMPLETE METABOLIC PANEL WITHOUT GFR
Alkaline Phosphatase: 64 U/L (ref 39–117)
Calcium: 9.3 mg/dL (ref 8.4–10.5)
GFR, Est African American: >89 mL/min
GFR, Est Non African American: >89 mL/min
Glucose, Bld: 95 mg/dL (ref 70–99)
Sodium: 141 meq/L (ref 135–145)
Total Bilirubin: 0.3 mg/dL (ref 0.2–1.2)
Total Protein: 7.3 g/dL (ref 6.0–8.3)

## 2014-10-07 LAB — COMPLETE METABOLIC PANEL WITH GFR
ALT: 28 U/L (ref 0–35)
AST: 17 U/L (ref 0–37)
Albumin: 4.2 g/dL (ref 3.5–5.2)
BUN: 10 mg/dL (ref 6–23)
CO2: 27 mEq/L (ref 19–32)
Chloride: 106 mEq/L (ref 96–112)
Creat: 0.8 mg/dL (ref 0.50–1.10)
Potassium: 3.5 mEq/L (ref 3.5–5.3)

## 2014-10-07 LAB — TSH: TSH: 1.145 u[IU]/mL (ref 0.350–4.500)

## 2014-10-07 LAB — HIV ANTIBODY (ROUTINE TESTING W REFLEX): HIV 1&2 Ab, 4th Generation: NONREACTIVE

## 2014-10-08 ENCOUNTER — Telehealth: Payer: Self-pay | Admitting: Family Medicine

## 2014-10-08 LAB — GC/CHLAMYDIA PROBE AMP
CT Probe RNA: NEGATIVE
GC Probe RNA: NEGATIVE

## 2014-10-08 LAB — URINE CULTURE: Colony Count: 70000

## 2014-10-08 NOTE — Telephone Encounter (Signed)
Unable to accept incoming phone calls when called.

## 2014-10-11 ENCOUNTER — Encounter: Payer: Self-pay | Admitting: Family Medicine

## 2014-10-11 ENCOUNTER — Telehealth: Payer: Self-pay | Admitting: Family Medicine

## 2014-10-11 NOTE — Telephone Encounter (Signed)
NOne of the numbers are either accepting incoming calls or working number, tried all numbers listed including emergency contacts

## 2014-12-31 ENCOUNTER — Encounter (HOSPITAL_COMMUNITY): Payer: Self-pay | Admitting: *Deleted

## 2014-12-31 ENCOUNTER — Emergency Department (HOSPITAL_COMMUNITY)
Admission: EM | Admit: 2014-12-31 | Discharge: 2015-01-01 | Discharge status: Against Medical Advice | Payer: Medicaid Other | Attending: Emergency Medicine | Admitting: Emergency Medicine

## 2014-12-31 DIAGNOSIS — R42 Dizziness and giddiness: Secondary | ICD-10-CM | POA: Insufficient documentation

## 2014-12-31 DIAGNOSIS — R109 Unspecified abdominal pain: Secondary | ICD-10-CM | POA: Diagnosis not present

## 2014-12-31 DIAGNOSIS — M549 Dorsalgia, unspecified: Secondary | ICD-10-CM | POA: Diagnosis not present

## 2014-12-31 DIAGNOSIS — J45909 Unspecified asthma, uncomplicated: Secondary | ICD-10-CM | POA: Diagnosis not present

## 2014-12-31 LAB — CBC WITH DIFFERENTIAL/PLATELET
BASOS PCT: 0 % (ref 0–1)
Basophils Absolute: 0 10*3/uL (ref 0.0–0.1)
EOS ABS: 0.1 10*3/uL (ref 0.0–0.7)
Eosinophils Relative: 1 % (ref 0–5)
HCT: 41 % (ref 36.0–46.0)
HEMOGLOBIN: 13.8 g/dL (ref 12.0–15.0)
Lymphocytes Relative: 22 % (ref 12–46)
Lymphs Abs: 2.7 10*3/uL (ref 0.7–4.0)
MCH: 28.2 pg (ref 26.0–34.0)
MCHC: 33.7 g/dL (ref 30.0–36.0)
MCV: 83.7 fL (ref 78.0–100.0)
MONOS PCT: 5 % (ref 3–12)
Monocytes Absolute: 0.6 10*3/uL (ref 0.1–1.0)
NEUTROS PCT: 72 % (ref 43–77)
Neutro Abs: 8.6 10*3/uL — ABNORMAL HIGH (ref 1.7–7.7)
Platelets: 335 10*3/uL (ref 150–400)
RBC: 4.9 MIL/uL (ref 3.87–5.11)
RDW: 12.4 % (ref 11.5–15.5)
WBC: 12.1 10*3/uL — ABNORMAL HIGH (ref 4.0–10.5)

## 2014-12-31 LAB — COMPREHENSIVE METABOLIC PANEL
ALBUMIN: 3.7 g/dL (ref 3.5–5.0)
ALT: 23 U/L (ref 14–54)
ANION GAP: 6 (ref 5–15)
AST: 19 U/L (ref 15–41)
Alkaline Phosphatase: 59 U/L (ref 38–126)
BILIRUBIN TOTAL: 0.6 mg/dL (ref 0.3–1.2)
BUN: 10 mg/dL (ref 6–20)
CO2: 25 mmol/L (ref 22–32)
CREATININE: 0.74 mg/dL (ref 0.44–1.00)
Calcium: 8.7 mg/dL — ABNORMAL LOW (ref 8.9–10.3)
Chloride: 107 mmol/L (ref 101–111)
GFR calc non Af Amer: >60 mL/min (ref >60)
Glucose, Bld: 127 mg/dL — ABNORMAL HIGH (ref 65–99)
Potassium: 3.2 mmol/L — ABNORMAL LOW (ref 3.5–5.1)
Sodium: 138 mmol/L (ref 135–145)
TOTAL PROTEIN: 7.2 g/dL (ref 6.5–8.1)

## 2014-12-31 LAB — LIPASE, BLOOD: LIPASE: 27 U/L (ref 22–51)

## 2014-12-31 NOTE — ED Notes (Signed)
Pt reports dizziness, abdominal pain, and back pain since yesterday. Pt denies n/v/d. Pt states she was recently dx with ear infection and vertigo march 30th.

## 2014-12-31 NOTE — ED Notes (Signed)
Pt called for room placement with no answer. 

## 2015-02-03 ENCOUNTER — Encounter (HOSPITAL_COMMUNITY): Payer: Self-pay | Admitting: Emergency Medicine

## 2015-02-03 ENCOUNTER — Other Ambulatory Visit (HOSPITAL_COMMUNITY)
Admission: RE | Admit: 2015-02-03 | Discharge: 2015-02-03 | Disposition: A | Discharge status: Home / Self Care | Payer: Medicaid Other | Source: Ambulatory Visit | Attending: Family Medicine | Admitting: Family Medicine

## 2015-02-03 ENCOUNTER — Emergency Department (HOSPITAL_COMMUNITY)
Admission: EM | Admit: 2015-02-03 | Discharge: 2015-02-03 | Disposition: A | Discharge status: Home / Self Care | Payer: Medicaid Other | Source: Home / Self Care

## 2015-02-03 DIAGNOSIS — N760 Acute vaginitis: Secondary | ICD-10-CM | POA: Diagnosis not present

## 2015-02-03 DIAGNOSIS — Z113 Encounter for screening for infections with a predominantly sexual mode of transmission: Secondary | ICD-10-CM | POA: Diagnosis not present

## 2015-02-03 DIAGNOSIS — B3731 Acute candidiasis of vulva and vagina: Secondary | ICD-10-CM

## 2015-02-03 DIAGNOSIS — A499 Bacterial infection, unspecified: Secondary | ICD-10-CM | POA: Diagnosis not present

## 2015-02-03 DIAGNOSIS — B373 Candidiasis of vulva and vagina: Secondary | ICD-10-CM | POA: Diagnosis not present

## 2015-02-03 DIAGNOSIS — B9689 Other specified bacterial agents as the cause of diseases classified elsewhere: Secondary | ICD-10-CM

## 2015-02-03 LAB — POCT URINALYSIS DIP (DEVICE)
BILIRUBIN URINE: NEGATIVE
Glucose, UA: NEGATIVE mg/dL
HGB URINE DIPSTICK: NEGATIVE
KETONES UR: NEGATIVE mg/dL
NITRITE: NEGATIVE
PH: 6.5 (ref 5.0–8.0)
Protein, ur: NEGATIVE mg/dL
SPECIFIC GRAVITY, URINE: 1.02 (ref 1.005–1.030)
Urobilinogen, UA: 0.2 mg/dL (ref 0.0–1.0)

## 2015-02-03 LAB — POCT PREGNANCY, URINE: PREG TEST UR: NEGATIVE

## 2015-02-03 MED ORDER — METRONIDAZOLE 500 MG PO TABS: 500.0000 mg | ORAL_TABLET | Freq: Two times a day (BID) | ORAL | Status: AC

## 2015-02-03 MED ORDER — FLUCONAZOLE 200 MG PO TABS: ORAL_TABLET | ORAL | Status: AC

## 2015-02-03 NOTE — Discharge Instructions (Signed)
Use an over the counter Monistat cream treatment for comfort of the vaginal labia.  Bacterial Vaginosis Bacterial vaginosis is a vaginal infection that occurs when the normal balance of bacteria in the vagina is disrupted. It results from an overgrowth of certain bacteria. This is the most common vaginal infection in women of childbearing age. Treatment is important to prevent complications, especially in pregnant women, as it can cause a premature delivery. CAUSES  Bacterial vaginosis is caused by an increase in harmful bacteria that are normally present in smaller amounts in the vagina. Several different kinds of bacteria can cause bacterial vaginosis. However, the reason that the condition develops is not fully understood. RISK FACTORS Certain activities or behaviors can put you at an increased risk of developing bacterial vaginosis, including:  Having a new sex partner or multiple sex partners.  Douching.  Using an intrauterine device (IUD) for contraception. Women do not get bacterial vaginosis from toilet seats, bedding, swimming pools, or contact with objects around them. SIGNS AND SYMPTOMS  Some women with bacterial vaginosis have no signs or symptoms. Common symptoms include:  Grey vaginal discharge.  A fishlike odor with discharge, especially after sexual intercourse.  Itching or burning of the vagina and vulva.  Burning or pain with urination. DIAGNOSIS  Your health care provider will take a medical history and examine the vagina for signs of bacterial vaginosis. A sample of vaginal fluid may be taken. Your health care provider will look at this sample under a microscope to check for bacteria and abnormal cells. A vaginal pH test may also be done.  TREATMENT  Bacterial vaginosis may be treated with antibiotic medicines. These may be given in the form of a pill or a vaginal cream. A second round of antibiotics may be prescribed if the condition comes back after treatment.  HOME  CARE INSTRUCTIONS   Only take over-the-counter or prescription medicines as directed by your health care provider.  If antibiotic medicine was prescribed, take it as directed. Make sure you finish it even if you start to feel better.  Do not have sex until treatment is completed.  Tell all sexual partners that you have a vaginal infection. They should see their health care provider and be treated if they have problems, such as a mild rash or itching.  Practice safe sex by using condoms and only having one sex partner. SEEK MEDICAL CARE IF:   Your symptoms are not improving after 3 days of treatment.  You have increased discharge or pain.  You have a fever. MAKE SURE YOU:   Understand these instructions.  Will watch your condition.  Will get help right away if you are not doing well or get worse. FOR MORE INFORMATION  Centers for Disease Control and Prevention, Division of STD Prevention: [employer]za American Sexual Health Association (ASHA): www.ashastd.org  Document Released: 06/25/2005 Document Revised: 04/15/2013 Document Reviewed: 02/04/2013 [facility] Patient Information 2015 [place], [place]. This information is not intended to replace advice given to you by your health care provider. Make sure you discuss any questions you have with your health care provider.

## 2015-02-03 NOTE — ED Provider Notes (Signed)
CSN: [id number]     Arrival date & time 02/03/15  1712 History   None    Chief Complaint  Patient presents with  . Vaginal Discharge  . Otitis Media   (Consider location/radiation/quality/duration/timing/severity/associated sxs/prior Treatment)  HPI   Patient is a 29 year old female presents tonight with complaints of vaginal discharge and discomfort and a chronic ear complaint. Patient chose to have her vaginal discharge assessed this evening. Patient states that during her last menstrual cycle her menses was brown in color and since then she has been having a yellowish green discharge that has a fishy smelling odor. Patient denies any vaginal sores or blisters, but states that she has discomfort with urination not at onset but rather with urination over the labia. Patient has Mirena in place for birth control and last unprotected sex was in April of this year. Denies any medications, recent antibiotic and denies any allergies to medications.  Past Medical History  Diagnosis Date  . PID (pelvic inflammatory disease)   . HPV (human papilloma virus) infection   . Asthma   . Abscess    Past Surgical History  Procedure Laterality Date  . Irrigation and debridement abscess  02/06/2012    Procedure: IRRIGATION AND DEBRIDEMENT ABSCESS;  Surgeon: Mariella Saa, MD;  Location: [facility] OR;  Service: General;  Laterality: Right;  I & D back abscess  . Cholecystectomy     History reviewed. No pertinent family history. History  Substance Use Topics  . Smoking status: Never Smoker   . Smokeless tobacco: Not on file  . Alcohol Use: No   OB History    Gravida Para Term Preterm AB TAB SAB Ectopic Multiple Living   1              Review of Systems  Allergies  Review of patient's allergies indicates no known allergies.  Home Medications   Prior to Admission medications   Medication Sig Start Date End Date Taking? Authorizing Provider  fluconazole (DIFLUCAN) 200 MG tablet Take one (200  mg) tablet today and then the second (200 mg) tablet 3 days later. 02/03/15   Servando Salina, NP  metroNIDAZOLE (FLAGYL) 500 MG tablet Take 1 tablet (500 mg total) by mouth 2 (two) times daily. 02/03/15   Servando Salina, NP   BP 121/83 mmHg  Pulse 91  Temp(Src) 98.2 F (36.8 C) (Oral)  Resp 16  SpO2 100%   Physical Exam  Constitutional: She appears well-developed and well-nourished. No distress.  Eyes: Conjunctivae are normal. Pupils are equal, round, and reactive to light. Right eye exhibits no discharge. Left eye exhibits no discharge. No scleral icterus.  Neck: Normal range of motion. Neck supple. No thyromegaly present.  Cardiovascular: Normal rate, regular rhythm, normal heart sounds and intact distal pulses.  Exam reveals no gallop and no friction rub.   No murmur heard. Pulmonary/Chest: Effort normal and breath sounds normal. No respiratory distress. She has no wheezes. She has no rales. She exhibits no tenderness.  Genitourinary: Uterus normal. No labial fusion. There is no rash, tenderness, lesion or injury on the right labia. There is no rash, tenderness, lesion or injury on the left labia. Uterus is not tender. Cervix exhibits no motion tenderness, no discharge and no friability. Right adnexum displays no mass, no tenderness and no fullness. Left adnexum displays no mass, no tenderness and no fullness. No tenderness or bleeding in the vagina. Vaginal discharge found.  Milky tan to white vaginal discharge noted in vaginal vault  and frothing from cervical os. Negative for cervical motion tenderness, negative masses or discomfort with palpation of adnexa. Patient advised not able to visualize IUD string from cervical os and to follow-up with OB  Skin: She is not diaphoretic.  Nursing note and vitals reviewed.   ED Course  Procedures (including critical care time) Labs Review Labs Reviewed  POCT URINALYSIS DIP (DEVICE) - Abnormal; Notable for the following:    Leukocytes, UA  TRACE (*)    All other components within normal limits  RPR  HIV ANTIBODY (ROUTINE TESTING)  POCT PREGNANCY, URINE  CERVICOVAGINAL ANCILLARY ONLY   Results for orders placed or performed during the hospital encounter of 02/03/15  POCT urinalysis dip (device)  Result Value Ref Range   Glucose, UA NEGATIVE NEGATIVE mg/dL   Bilirubin Urine NEGATIVE NEGATIVE   Ketones, ur NEGATIVE NEGATIVE mg/dL   Specific Gravity, Urine 1.020 1.005 - 1.030   Hgb urine dipstick NEGATIVE NEGATIVE   pH 6.5 5.0 - 8.0   Protein, ur NEGATIVE NEGATIVE mg/dL   Urobilinogen, UA 0.2 0.0 - 1.0 mg/dL   Nitrite NEGATIVE NEGATIVE   Leukocytes, UA TRACE (A) NEGATIVE  Pregnancy, urine POC  Result Value Ref Range   Preg Test, Ur NEGATIVE NEGATIVE   Pending GC, Chlamydia, RPR, HIV antibody, and AFFIRM testing. Imaging Review No results found.   MDM   1. Bacterial vaginosis   2. Vaginal candida    Meds ordered this encounter  Medications  . fluconazole (DIFLUCAN) 200 MG tablet    Sig: Take one (200 mg) tablet today and then the second (200 mg) tablet 3 days later.    Dispense:  2 tablet    Refill:  0  . metroNIDAZOLE (FLAGYL) 500 MG tablet    Sig: Take 1 tablet (500 mg total) by mouth 2 (two) times daily.    Dispense:  14 tablet    Refill:  0   Patient advised to eat daily or use a daily probiotic with Lactobacillus. In addition patient told to avoid soap, lotions, perfumes or other substances such as douching on vaginal tissue, water only for cleansing.  The patient verbalizes understanding and agrees to plan of care.       Servando Salina, NP 02/03/15 [id number]

## 2015-02-03 NOTE — ED Notes (Signed)
C/o bilateral ear infection  Does have drainage, lightheadedness and nausea  States does have vaginal discharge with odor  Does want to be check for std

## 2015-02-04 LAB — CERVICOVAGINAL ANCILLARY ONLY
Chlamydia: NEGATIVE
Neisseria Gonorrhea: NEGATIVE
Wet Prep (BD Affirm): NEGATIVE

## 2015-02-04 LAB — HIV ANTIBODY (ROUTINE TESTING W REFLEX): HIV Screen 4th Generation wRfx: NONREACTIVE

## 2015-02-04 LAB — RPR: RPR: NONREACTIVE

## 2015-02-04 NOTE — ED Notes (Signed)
Final report of GYN testing negative for GC, chlamydia, yeast, BV

## 2017-01-05 IMAGING — CT CT HEAD W/O CM
2 series · 16 of 30 positions shown, 20 images · non-contrast
Comparison: None.

CLINICAL DATA: Dizziness.  Headache.  Weakness.  Blurred vision.

EXAM:
CT HEAD WITHOUT CONTRAST
TECHNIQUE: Contiguous axial images were obtained from the base of the skull
through the vertex without intravenous contrast.

[Series 201: head w/o, idose (1) · axial · non-contrast · 0.49mm/px · z∈[+67,+197]mm · 13 of 32 slices shown, 17 images]
[im 3/32  brain]
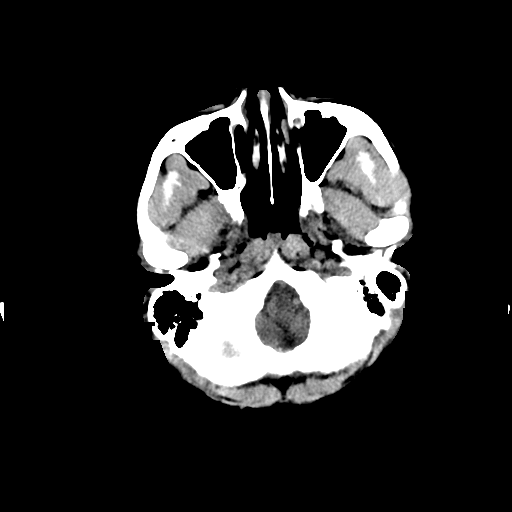
[im 3/32  bone]
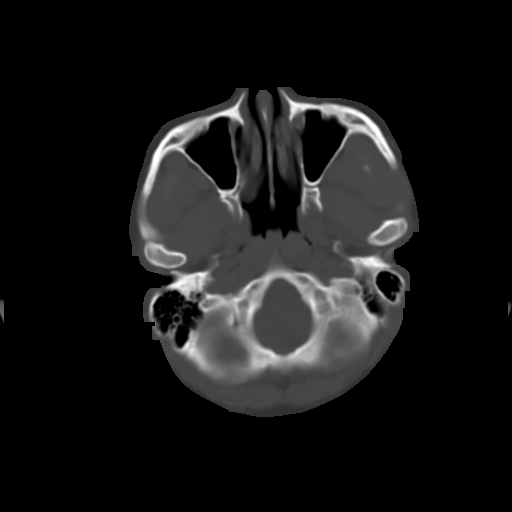
[im 5/32  brain]
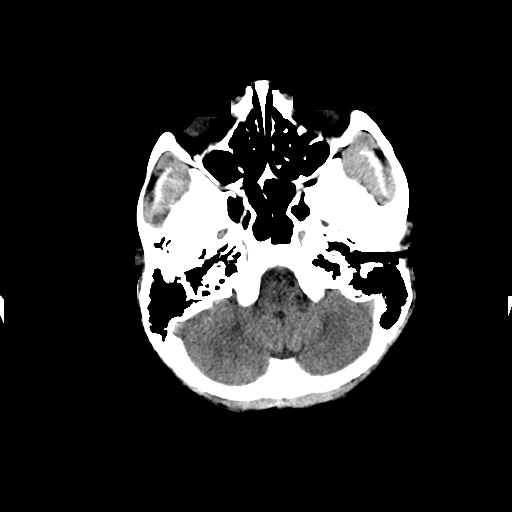
[im 7/32  brain]
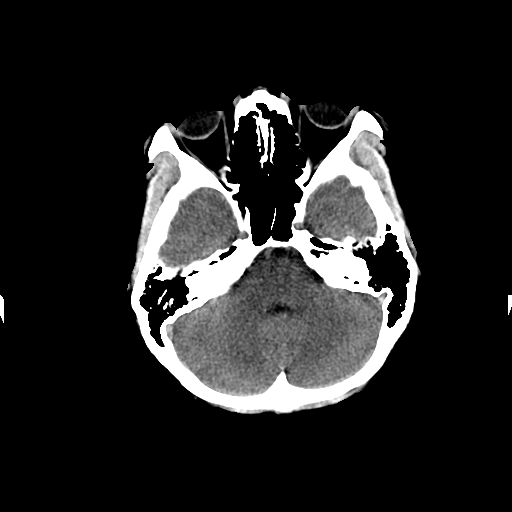
[im 9/32  brain]
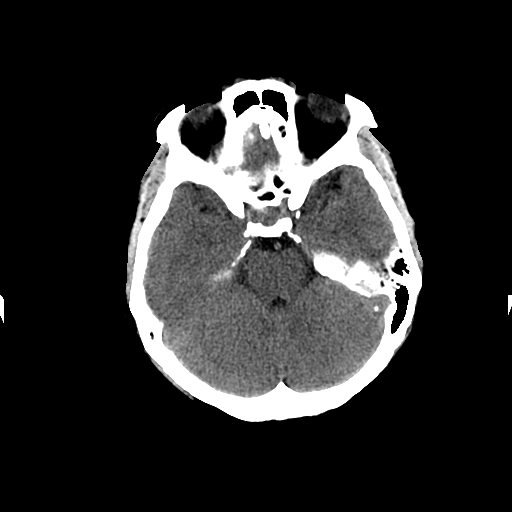
[im 12/32  brain]
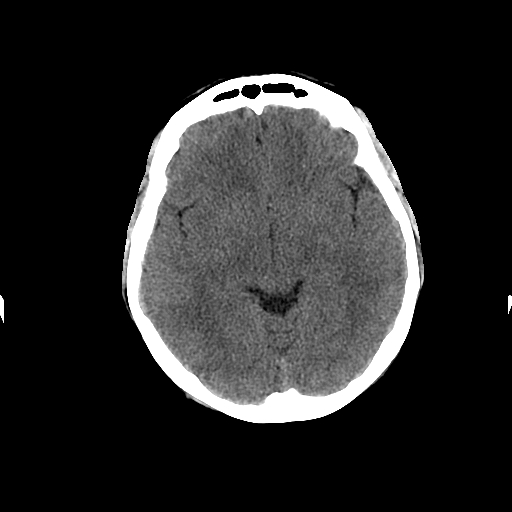
[im 12/32  bone]
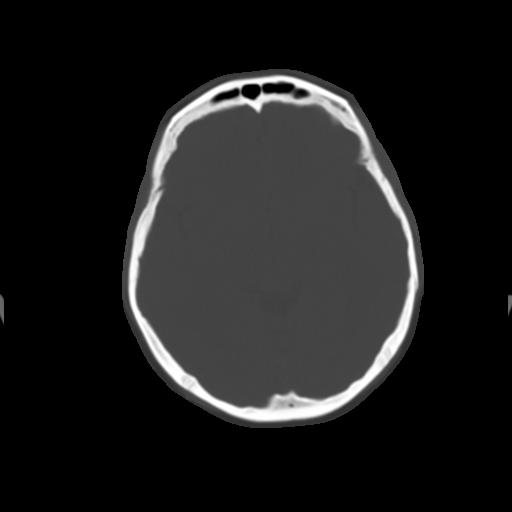
[im 14/32  brain]
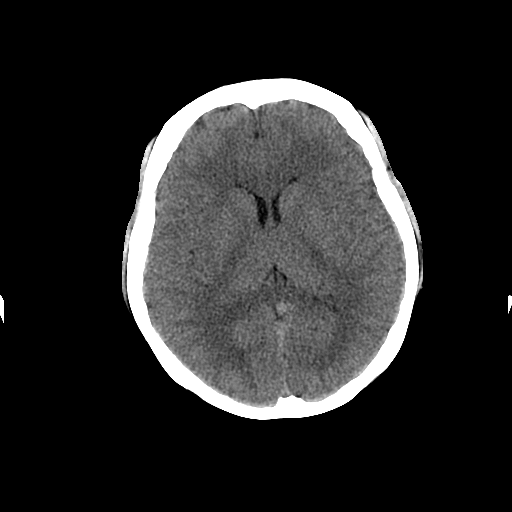
[im 16/32  brain]
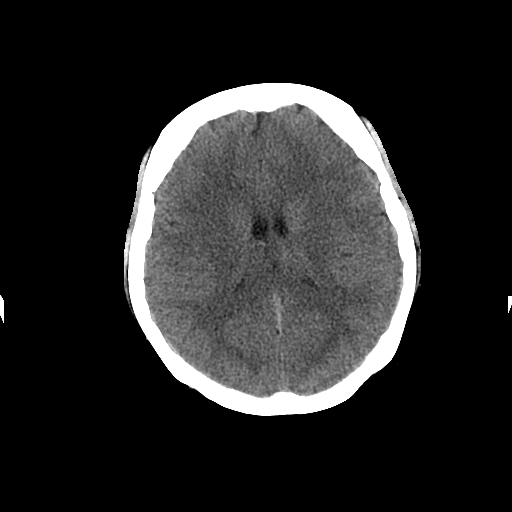
[im 18/32  brain]
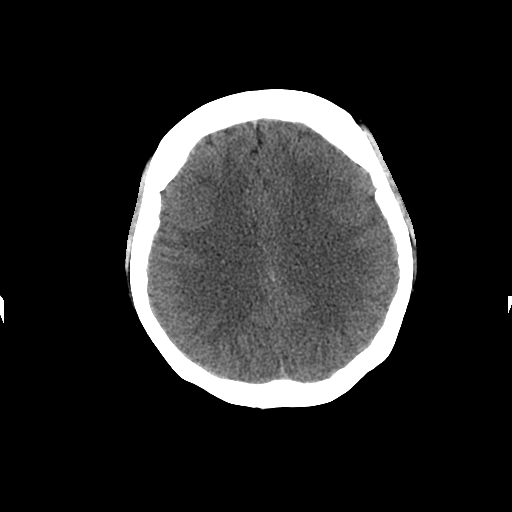
[im 20/32  brain]
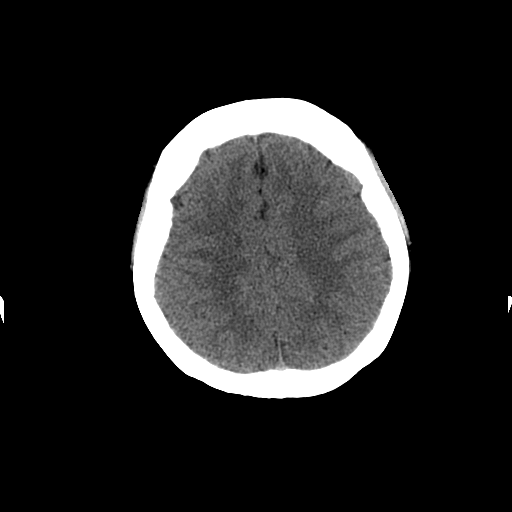
[im 20/32  bone]
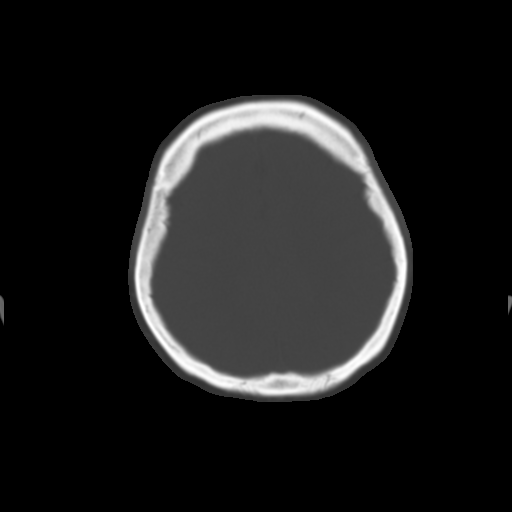
[im 23/32  brain]
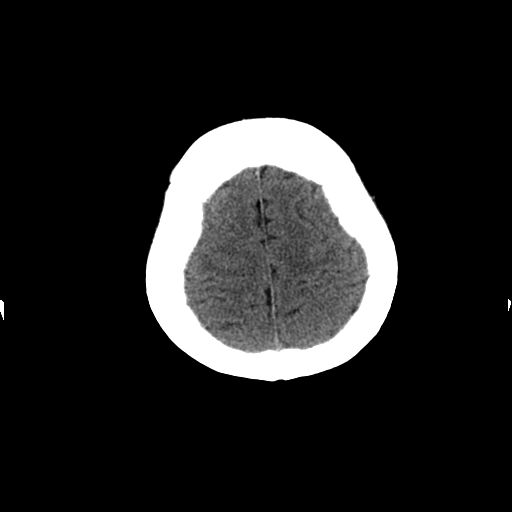
[im 25/32  brain]
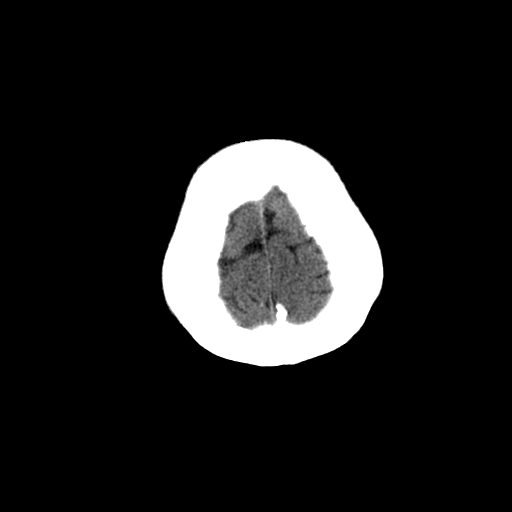
[im 27/32  brain]
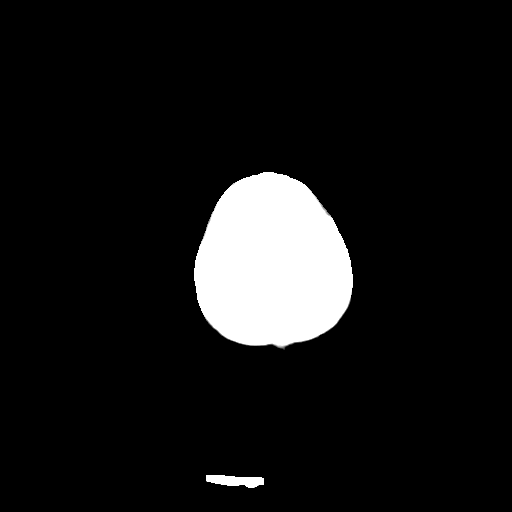
[im 29/32  brain]
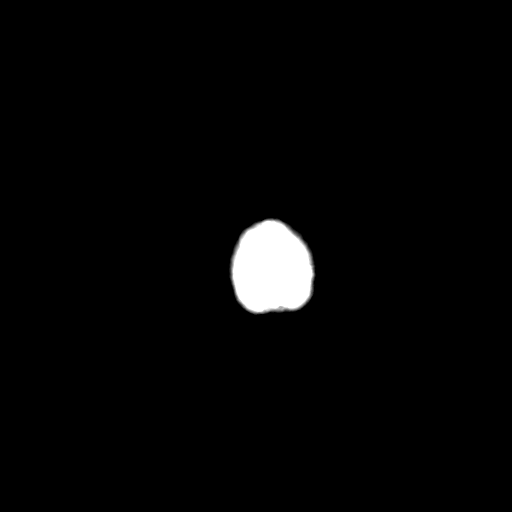
[im 29/32  bone]
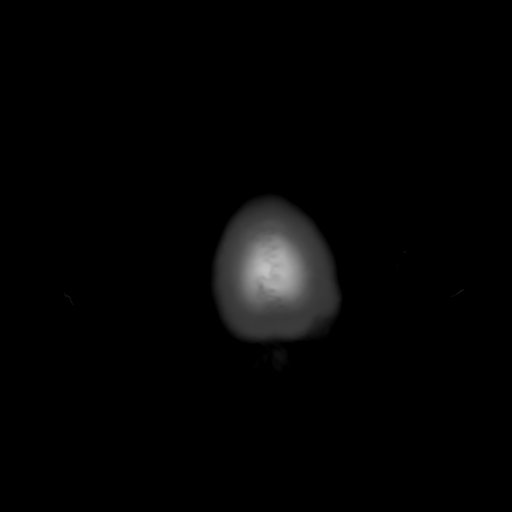

[Series 202: head w/o bone, idose (1) · axial · non-contrast · 0.49mm/px · z∈[+67,+112]mm · 3 of 32 slices shown]
[im 3/32  bone]
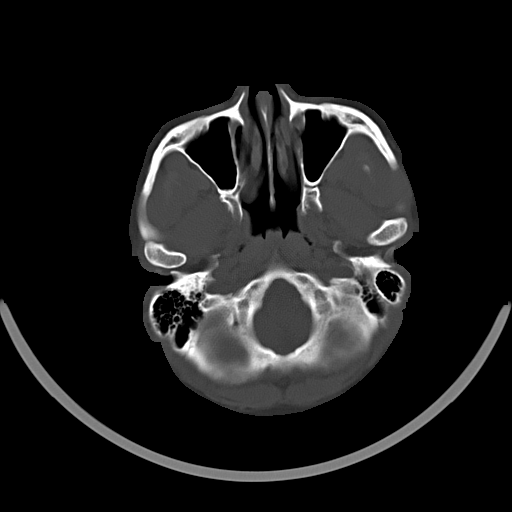
[im 7/32  bone]
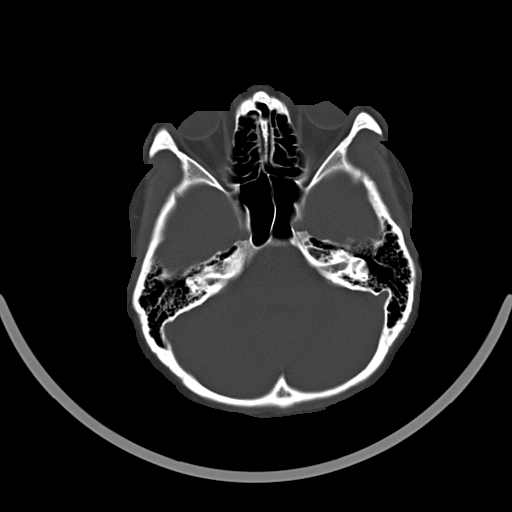
[im 12/32  bone]
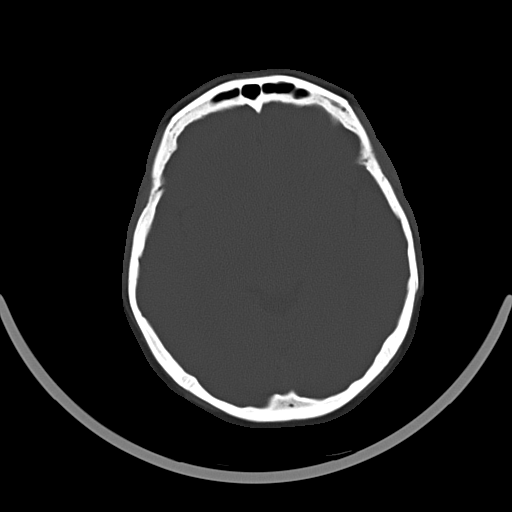

[16 of 30 positions shown; findings below may reference images not displayed]

FINDINGS: Sinuses/Soft tissues: Clear paranasal sinuses and mastoid air cells.

Intracranial: No mass lesion, hemorrhage, hydrocephalus, acute
infarct, intra-axial, or extra-axial fluid collection.
IMPRESSION: Normal head CT.
